# Patient Record
Sex: Male | Born: 1970 | Race: Black or African American | Hispanic: No | Marital: Married | Smoking: Former smoker
Health system: Southern US, Community
[De-identification: ages and names within clinical notes are randomized; demographics above are authoritative.]

## PROBLEM LIST (undated history)

## (undated) DIAGNOSIS — R0602 Shortness of breath: Secondary | ICD-10-CM

## (undated) DIAGNOSIS — Z9109 Other allergy status, other than to drugs and biological substances: Secondary | ICD-10-CM

## (undated) DIAGNOSIS — R51 Headache: Secondary | ICD-10-CM

## (undated) DIAGNOSIS — K219 Gastro-esophageal reflux disease without esophagitis: Secondary | ICD-10-CM

## (undated) DIAGNOSIS — K5900 Constipation, unspecified: Secondary | ICD-10-CM

## (undated) HISTORY — PX: CIRCUMCISION: SUR203

---

## 2011-04-19 ENCOUNTER — Inpatient Hospital Stay (HOSPITAL_COMMUNITY)
Admission: EM | Admit: 2011-04-19 | Discharge: 2011-04-23 | DRG: 418 | Disposition: A | Discharge status: Home / Self Care | Payer: 59 | Attending: Surgery | Admitting: Surgery

## 2011-04-19 ENCOUNTER — Emergency Department (HOSPITAL_COMMUNITY): Payer: 59

## 2011-04-19 ENCOUNTER — Encounter (HOSPITAL_COMMUNITY): Payer: Self-pay | Admitting: Emergency Medicine

## 2011-04-19 DIAGNOSIS — N390 Urinary tract infection, site not specified: Secondary | ICD-10-CM | POA: Diagnosis present

## 2011-04-19 DIAGNOSIS — K8020 Calculus of gallbladder without cholecystitis without obstruction: Secondary | ICD-10-CM | POA: Diagnosis present

## 2011-04-19 DIAGNOSIS — K819 Cholecystitis, unspecified: Secondary | ICD-10-CM

## 2011-04-19 DIAGNOSIS — K8050 Calculus of bile duct without cholangitis or cholecystitis without obstruction: Secondary | ICD-10-CM | POA: Diagnosis present

## 2011-04-19 DIAGNOSIS — K8070 Calculus of gallbladder and bile duct without cholecystitis without obstruction: Principal | ICD-10-CM | POA: Diagnosis present

## 2011-04-19 HISTORY — DX: Gastro-esophageal reflux disease without esophagitis: K21.9

## 2011-04-19 HISTORY — DX: Headache: R51

## 2011-04-19 HISTORY — DX: Other allergy status, other than to drugs and biological substances: Z91.09

## 2011-04-19 HISTORY — DX: Shortness of breath: R06.02

## 2011-04-19 LAB — URINALYSIS, ROUTINE W REFLEX MICROSCOPIC
Glucose, UA: NEGATIVE mg/dL
Hgb urine dipstick: NEGATIVE
Ketones, ur: 40 mg/dL — AB
Nitrite: POSITIVE — AB
Protein, ur: NEGATIVE mg/dL
Specific Gravity, Urine: 1.024 (ref 1.005–1.030)
Urobilinogen, UA: 4 mg/dL — ABNORMAL HIGH (ref 0.0–1.0)
pH: 6 (ref 5.0–8.0)

## 2011-04-19 LAB — URINE MICROSCOPIC-ADD ON

## 2011-04-19 LAB — LIPASE, BLOOD: Lipase: 19 U/L (ref 11–59)

## 2011-04-19 LAB — COMPREHENSIVE METABOLIC PANEL
ALT: 263 U/L — ABNORMAL HIGH (ref 0–53)
AST: 240 U/L — ABNORMAL HIGH (ref 0–37)
Albumin: 4 g/dL (ref 3.5–5.2)
Alkaline Phosphatase: 228 U/L — ABNORMAL HIGH (ref 39–117)
BUN: 7 mg/dL (ref 6–23)
CO2: 26 mEq/L (ref 19–32)
Calcium: 9.3 mg/dL (ref 8.4–10.5)
Chloride: 99 mEq/L (ref 96–112)
Creatinine, Ser: 0.98 mg/dL (ref 0.50–1.35)
GFR calc Af Amer: >90 mL/min (ref >90)
GFR calc non Af Amer: >90 mL/min (ref >90)
Glucose, Bld: 86 mg/dL (ref 70–99)
Potassium: 3.6 mEq/L (ref 3.5–5.1)
Sodium: 136 mEq/L (ref 135–145)
Total Bilirubin: 4.7 mg/dL — ABNORMAL HIGH (ref 0.3–1.2)
Total Protein: 7.6 g/dL (ref 6.0–8.3)

## 2011-04-19 LAB — CBC
HCT: 42.5 % (ref 39.0–52.0)
Hemoglobin: 14.2 g/dL (ref 13.0–17.0)
MCH: 27.7 pg (ref 26.0–34.0)
MCHC: 33.4 g/dL (ref 30.0–36.0)
MCV: 83 fL (ref 78.0–100.0)
Platelets: 125 10*3/uL — ABNORMAL LOW (ref 150–400)
RBC: 5.12 MIL/uL (ref 4.22–5.81)
RDW: 12.6 % (ref 11.5–15.5)
WBC: 3.9 10*3/uL — ABNORMAL LOW (ref 4.0–10.5)

## 2011-04-19 LAB — DIFFERENTIAL
Basophils Absolute: 0 10*3/uL (ref 0.0–0.1)
Basophils Relative: 0 % (ref 0–1)
Eosinophils Absolute: 0.1 10*3/uL (ref 0.0–0.7)
Eosinophils Relative: 2 % (ref 0–5)
Lymphocytes Relative: 25 % (ref 12–46)
Lymphs Abs: 1 10*3/uL (ref 0.7–4.0)
Monocytes Absolute: 0.4 10*3/uL (ref 0.1–1.0)
Monocytes Relative: 9 % (ref 3–12)
Neutro Abs: 2.5 10*3/uL (ref 1.7–7.7)
Neutrophils Relative %: 64 % (ref 43–77)

## 2011-04-19 NOTE — ED Notes (Signed)
Pt. Reports his abdominal pain has been off and on for about a year.  He has nausea at times but is not currently nauseated.

## 2011-04-19 NOTE — ED Notes (Signed)
Pt states he is here tonight for abd pain that he has had off and on now for several months  Pt states Saturday his stomach started hurting in his lower abdomen feeling full and has cramping in the upper part of his abdomen  Pt states sometimes he makes himself throw up to relieve the pressure  Pt states the pressure in his abdomen makes his back hurt causing numbness in his fingers  Pt states he has not eaten since Saturday and has not had a BM since last week

## 2011-04-20 ENCOUNTER — Encounter (HOSPITAL_COMMUNITY): Admission: EM | Disposition: A | Discharge status: Home / Self Care | Payer: Self-pay | Source: Home / Self Care

## 2011-04-20 ENCOUNTER — Encounter (HOSPITAL_COMMUNITY): Payer: Self-pay | Admitting: Gastroenterology

## 2011-04-20 ENCOUNTER — Emergency Department (HOSPITAL_COMMUNITY): Payer: 59

## 2011-04-20 DIAGNOSIS — K8050 Calculus of bile duct without cholangitis or cholecystitis without obstruction: Secondary | ICD-10-CM | POA: Diagnosis present

## 2011-04-20 DIAGNOSIS — R109 Unspecified abdominal pain: Secondary | ICD-10-CM

## 2011-04-20 DIAGNOSIS — K8020 Calculus of gallbladder without cholecystitis without obstruction: Secondary | ICD-10-CM | POA: Diagnosis present

## 2011-04-20 HISTORY — PX: ERCP: SHX5425

## 2011-04-20 LAB — BASIC METABOLIC PANEL
BUN: 8 mg/dL (ref 6–23)
CO2: 29 mEq/L (ref 19–32)
Chloride: 100 mEq/L (ref 96–112)
Creatinine, Ser: 1.1 mg/dL (ref 0.50–1.35)
Glucose, Bld: 85 mg/dL (ref 70–99)

## 2011-04-20 LAB — CBC
HCT: 44.6 % (ref 39.0–52.0)
Hemoglobin: 14.8 g/dL (ref 13.0–17.0)
MCV: 83.2 fL (ref 78.0–100.0)
WBC: 4.3 10*3/uL (ref 4.0–10.5)

## 2011-04-20 LAB — DIFFERENTIAL
Lymphocytes Relative: 26 % (ref 12–46)
Monocytes Absolute: 0.3 10*3/uL (ref 0.1–1.0)
Monocytes Relative: 7 % (ref 3–12)
Neutro Abs: 2.8 10*3/uL (ref 1.7–7.7)

## 2011-04-20 LAB — HEPATIC FUNCTION PANEL
ALT: 244 U/L — ABNORMAL HIGH (ref 0–53)
Alkaline Phosphatase: 256 U/L — ABNORMAL HIGH (ref 39–117)
Bilirubin, Direct: 5.5 mg/dL — ABNORMAL HIGH (ref 0.0–0.3)
Indirect Bilirubin: 1.9 mg/dL — ABNORMAL HIGH (ref 0.3–0.9)
Total Bilirubin: 7.4 mg/dL — ABNORMAL HIGH (ref 0.3–1.2)

## 2011-04-20 LAB — TYPE AND SCREEN
ABO/RH(D): O POS
Antibody Screen: NEGATIVE

## 2011-04-20 LAB — ABO/RH: ABO/RH(D): O POS

## 2011-04-20 SURGERY — ERCP, WITH INTERVENTION IF INDICATED
Anesthesia: Moderate Sedation

## 2011-04-20 MED ORDER — SODIUM CHLORIDE 0.9 % IV SOLN
INTRAVENOUS | Status: DC
  Administered 2011-04-20: 04:00:00 via INTRAVENOUS

## 2011-04-20 MED ORDER — MIDAZOLAM HCL 10 MG/2ML IJ SOLN
INTRAMUSCULAR | Status: DC | PRN
  Administered 2011-04-20 (×5): 1 mg via INTRAVENOUS
  Administered 2011-04-20: 2 mg via INTRAVENOUS
  Administered 2011-04-20 (×3): 1 mg via INTRAVENOUS
  Administered 2011-04-20: 2 mg via INTRAVENOUS
  Administered 2011-04-20: 1 mg via INTRAVENOUS
  Administered 2011-04-20: 2 mg via INTRAVENOUS
  Administered 2011-04-20: 1 mg via INTRAVENOUS

## 2011-04-20 MED ORDER — HYDROMORPHONE HCL PF 1 MG/ML IJ SOLN
1.0000 mg | INTRAMUSCULAR | Status: DC | PRN
  Administered 2011-04-20 – 2011-04-22 (×4): 1 mg via INTRAVENOUS
  Administered 2011-04-22: 2 mg via INTRAVENOUS
  Filled 2011-04-20 (×6): qty 1

## 2011-04-20 MED ORDER — SODIUM CHLORIDE 0.9 % IV SOLN
INTRAVENOUS | Status: DC | PRN
  Administered 2011-04-20: 12:00:00

## 2011-04-20 MED ORDER — PIPERACILLIN-TAZOBACTAM 3.375 G IVPB
3.3750 g | Freq: Three times a day (TID) | INTRAVENOUS | Status: DC
  Administered 2011-04-20 – 2011-04-23 (×10): 3.375 g via INTRAVENOUS
  Filled 2011-04-20 (×15): qty 50

## 2011-04-20 MED ORDER — ONDANSETRON HCL 4 MG/2ML IJ SOLN
4.0000 mg | Freq: Four times a day (QID) | INTRAMUSCULAR | Status: DC | PRN
  Administered 2011-04-20 – 2011-04-22 (×2): 4 mg via INTRAVENOUS
  Filled 2011-04-20 (×2): qty 2

## 2011-04-20 MED ORDER — PIPERACILLIN-TAZOBACTAM 3.375 G IVPB
3.3750 g | Freq: Once | INTRAVENOUS | Status: AC
  Administered 2011-04-20: 3.375 g via INTRAVENOUS
  Filled 2011-04-20 (×2): qty 50

## 2011-04-20 MED ORDER — FENTANYL CITRATE 0.05 MG/ML IJ SOLN
INTRAMUSCULAR | Status: DC | PRN
  Administered 2011-04-20 (×3): 12.5 ug via INTRAVENOUS
  Administered 2011-04-20: 25 ug via INTRAVENOUS
  Administered 2011-04-20: 12.5 ug via INTRAVENOUS
  Administered 2011-04-20 (×2): 25 ug via INTRAVENOUS
  Administered 2011-04-20 (×3): 12.5 ug via INTRAVENOUS

## 2011-04-20 MED ORDER — PANTOPRAZOLE SODIUM 40 MG IV SOLR
40.0000 mg | Freq: Every day | INTRAVENOUS | Status: DC
  Administered 2011-04-20 – 2011-04-22 (×3): 40 mg via INTRAVENOUS
  Filled 2011-04-20 (×5): qty 40

## 2011-04-20 MED ORDER — KCL IN DEXTROSE-NACL 20-5-0.45 MEQ/L-%-% IV SOLN
INTRAVENOUS | Status: DC
  Administered 2011-04-20 – 2011-04-22 (×5): via INTRAVENOUS
  Filled 2011-04-20 (×9): qty 1000

## 2011-04-20 MED ORDER — BUTAMBEN-TETRACAINE-BENZOCAINE 2-2-14 % EX AERO
INHALATION_SPRAY | CUTANEOUS | Status: DC | PRN
  Administered 2011-04-20: 2 via TOPICAL

## 2011-04-20 NOTE — H&P (Signed)
Kacee Koren is an 41 y.o. male.   Chief Complaint: abdominal pain HPI: 41 year old African American male comes to the ED complaining of several days of crampy upper abdominal pain. He has had this abdominal pain on and off for about a year. It will generally last several days and then resolved. He describes it as crampy pain in his upper abdomen and a sensation of feeling full in his lower abdomen. It is generally associated with nausea and some occasional emesis. He denies any jaundice or pruritus. He denies any acholic stools. He denies any pain or difficulty swallowing. The most recent episode started about 2 days ago. His discomfort has been constant. He reports a decreased appetite since Saturday. He reports no bowel movement since Friday. It is normal for him to have one bowel movement every 2-3 days. He has tried taking a stool softener for relief which was unsuccessful. He denies any personal or family history of cancer. He states that he hasn't had this checked out before because of insurance and job reasons. He denies any fevers or chills. Denies any melena or hematochezia. He denies any hematemesis.  History reviewed. No pertinent past medical history.  History reviewed. No pertinent past surgical history.  Family History  Problem Relation Age of Onset  . Hypertension Other   . Diabetes Other    Social History:  reports that he has never smoked. He does not have any smokeless tobacco history on file. He reports that he does not drink alcohol or use illicit drugs.  Allergies: No Known Allergies  Medications Prior to Admission  Medication Dose Route Frequency Provider Last Rate Last Dose  . 0.9 %  sodium chloride infusion   Intravenous Continuous Roma Kayser Schorr, NP 125 mL/hr at 04/20/11 0340    . piperacillin-tazobactam (ZOSYN) IVPB 3.375 g  3.375 g Intravenous Once Leanne Chang, NP   3.375 g at 04/20/11 0340   No current outpatient prescriptions on file as of  04/19/2011.    Results for orders placed during the hospital encounter of 04/19/11 (from the past 48 hour(s))  CBC     Status: Abnormal   Collection Time   04/19/11  9:35 PM      Component Value Range Comment   WBC 3.9 (*) 4.0 - 10.5 (K/uL)    RBC 5.12  4.22 - 5.81 (MIL/uL)    Hemoglobin 14.2  13.0 - 17.0 (g/dL)    HCT 16.1  09.6 - 04.5 (%)    MCV 83.0  78.0 - 100.0 (fL)    MCH 27.7  26.0 - 34.0 (pg)    MCHC 33.4  30.0 - 36.0 (g/dL)    RDW 40.9  81.1 - 91.4 (%)    Platelets 125 (*) 150 - 400 (K/uL)   DIFFERENTIAL     Status: Normal   Collection Time   04/19/11  9:35 PM      Component Value Range Comment   Neutrophils Relative 64  43 - 77 (%)    Neutro Abs 2.5  1.7 - 7.7 (K/uL)    Lymphocytes Relative 25  12 - 46 (%)    Lymphs Abs 1.0  0.7 - 4.0 (K/uL)    Monocytes Relative 9  3 - 12 (%)    Monocytes Absolute 0.4  0.1 - 1.0 (K/uL)    Eosinophils Relative 2  0 - 5 (%)    Eosinophils Absolute 0.1  0.0 - 0.7 (K/uL)    Basophils Relative 0  0 - 1 (%)  Basophils Absolute 0.0  0.0 - 0.1 (K/uL)   COMPREHENSIVE METABOLIC PANEL     Status: Abnormal   Collection Time   04/19/11  9:35 PM      Component Value Range Comment   Sodium 136  135 - 145 (mEq/L)    Potassium 3.6  3.5 - 5.1 (mEq/L)    Chloride 99  96 - 112 (mEq/L)    CO2 26  19 - 32 (mEq/L)    Glucose, Bld 86  70 - 99 (mg/dL)    BUN 7  6 - 23 (mg/dL)    Creatinine, Ser 4.09  0.50 - 1.35 (mg/dL)    Calcium 9.3  8.4 - 10.5 (mg/dL)    Total Protein 7.6  6.0 - 8.3 (g/dL)    Albumin 4.0  3.5 - 5.2 (g/dL)    AST 811 (*) 0 - 37 (U/L)    ALT 263 (*) 0 - 53 (U/L)    Alkaline Phosphatase 228 (*) 39 - 117 (U/L)    Total Bilirubin 4.7 (*) 0.3 - 1.2 (mg/dL)    GFR calc non Af Amer >90  >90 (mL/min)    GFR calc Af Amer >90  >90 (mL/min)   LIPASE, BLOOD     Status: Normal   Collection Time   04/19/11  9:35 PM      Component Value Range Comment   Lipase 19  11 - 59 (U/L)   URINALYSIS, ROUTINE W REFLEX MICROSCOPIC     Status: Abnormal     Collection Time   04/19/11 10:07 PM      Component Value Range Comment   Color, Urine ORANGE (*) YELLOW  BIOCHEMICALS MAY BE AFFECTED BY COLOR   APPearance CLEAR  CLEAR     Specific Gravity, Urine 1.024  1.005 - 1.030     pH 6.0  5.0 - 8.0     Glucose, UA NEGATIVE  NEGATIVE (mg/dL)    Hgb urine dipstick NEGATIVE  NEGATIVE     Bilirubin Urine LARGE (*) NEGATIVE     Ketones, ur 40 (*) NEGATIVE (mg/dL)    Protein, ur NEGATIVE  NEGATIVE (mg/dL)    Urobilinogen, UA 4.0 (*) 0.0 - 1.0 (mg/dL)    Nitrite POSITIVE (*) NEGATIVE     Leukocytes, UA SMALL (*) NEGATIVE    URINE MICROSCOPIC-ADD ON     Status: Abnormal   Collection Time   04/19/11 10:07 PM      Component Value Range Comment   Squamous Epithelial / LPF RARE  RARE     WBC, UA 0-2  <3 (WBC/hpf)    RBC / HPF 0-2  <3 (RBC/hpf)    Bacteria, UA MANY (*) RARE     Urine-Other MUCOUS PRESENT      US Abdomen Complete  04/20/2011  *RADIOLOGY REPORT*  Clinical Data:  Right upper quadrant abdominal pain and elevated LFTs.  ABDOMINAL ULTRASOUND COMPLETE  Comparison:  None  Findings:  Gallbladder:  There is mild gallbladder wall thickening, measuring 0.3 cm.  Multiple prominent layering stones are seen within the gallbladder, with several stones noted at the neck.  There is question of stones at the cystic duct, though this is not well characterized.  No definite ultrasonographic Murphy's sign is elicited; the patient's pain is centered under the xiphoid process.  There is no evidence of gallbladder dilatation to suggest obstruction at the cystic duct.  No pericholecystic fluid is seen.  Common Bile Duct:  0.8 cm in diameter; dilated, raising question for distal obstruction.  Liver:  Normal parenchymal echogenicity and echotexture; no focal lesions identified. There is suggestion of mild intrahepatic biliary ductal dilatation.  Limited Doppler evaluation demonstrates normal blood flow within the liver.  IVC:  Unremarkable in appearance.  Pancreas:   Although the pancreas is difficult to visualize in its entirety due to overlying bowel gas, no focal pancreatic abnormality is identified.  Spleen:  9.1 cm in length; within normal limits in size and echotexture.  Right kidney:  10.2 cm in length; normal in size, configuration and parenchymal echogenicity.  No evidence of mass or hydronephrosis.  Left kidney:  10.2 cm in length; normal in size, configuration and parenchymal echogenicity.  No evidence of mass or hydronephrosis.  Abdominal Aorta:  Normal in caliber; no aneurysm identified.  IMPRESSION:  1.  Mild dilatation of the common hepatic duct to 0.8 cm, with suggestion of mild intrahepatic biliary ductal dilatation, raising question for distal obstruction. 2.  Prominent stones noted layering dependently within the gallbladder.  Stones noted at the neck, with question of stones at the cystic duct.  Mild associated gallbladder wall thickening seen, but no pericholecystic fluid or ultrasonographic Murphy's sign is elicited.  No evidence of gallbladder dilatation to suggest obstruction at the cystic duct.  Original Report Authenticated By: Tonia Ghent, M.D.    Review of Systems  Constitutional: Positive for weight loss (10-20lb wt loss past year). Negative for fever, chills and diaphoresis.  HENT: Negative for nosebleeds.        Seasonal allergies  Eyes: Negative for blurred vision, double vision and redness.  Respiratory: Negative for shortness of breath and wheezing.   Cardiovascular: Negative for chest pain, palpitations, orthopnea and leg swelling.  Gastrointestinal:       See hpi  Genitourinary: Negative for dysuria, urgency and frequency.  Musculoskeletal: Negative for myalgias and joint pain.  Skin: Negative for itching and rash.  Neurological: Negative for tingling, focal weakness, seizures, loss of consciousness, weakness and headaches.  Endo/Heme/Allergies: Does not bruise/bleed easily.  Psychiatric/Behavioral: Negative for depression.      Blood pressure 133/88, pulse 64, temperature 98.8 F (37.1 C), temperature source Oral, resp. rate 14, weight 175 lb (79.379 kg), SpO2 97.00%. Physical Exam  Vitals reviewed. Constitutional: He is oriented to person, place, and time. He appears well-developed and well-nourished. No distress.  HENT:  Head: Normocephalic and atraumatic.  Right Ear: External ear normal.  Left Ear: External ear normal.  Nose: Nose normal.  Eyes: Conjunctivae are normal. Scleral icterus (mild) is present.  Neck: Normal range of motion. Neck supple. No JVD present. No tracheal deviation present. No thyromegaly present.  Cardiovascular: Normal rate, regular rhythm and normal heart sounds.   No murmur heard. Respiratory: Effort normal and breath sounds normal. No respiratory distress. He has no wheezes.  GI: Soft. Bowel sounds are normal. He exhibits no distension. There is tenderness in the right upper quadrant. There is no rigidity, no rebound and no guarding.  Musculoskeletal: Normal range of motion. He exhibits no edema and no tenderness.  Lymphadenopathy:    He has no cervical adenopathy.  Neurological: He is alert and oriented to person, place, and time. He exhibits normal muscle tone.  Skin: Skin is warm and dry. No rash noted. He is not diaphoretic. No erythema.  Psychiatric: He has a normal mood and affect. His behavior is normal. Judgment and thought content normal.     Assessment/Plan Symptomatic cholelithiasis versus acute cholecystitis Choledocholithiasis Urinary tract infection  His LFTs are elevated suggestive of a stone  in the common bile duct. We will consult GI medicine for an ERCP followed by cholecystectomy. He will be kept n.p.o. He will be continued on IV fluids and antibiotics. Sequential compression devices will be ordered. Chemical DVT prophylaxis will be held since hopefully will have an ERCP later today.  I discussed gallbladder disease and common bile duct stone management  with the patient.  Mary Sella. Andrey Campanile, MD, FACS General, Bariatric, & Minimally Invasive Surgery [facility], [place]   [facility] M 04/20/2011, 6:39 AM

## 2011-04-20 NOTE — ED Notes (Signed)
Blood drawn for lab.

## 2011-04-20 NOTE — ED Provider Notes (Signed)
History     CSN: [id number]  Arrival date & time 04/19/11  1839   First MD Initiated Contact with Patient 04/19/11 2021      No chief complaint on file.    Patient is a 41 y.o. male presenting with abdominal pain.  Abdominal Pain The primary symptoms of the illness include abdominal pain and nausea. The primary symptoms of the illness do not include fever, fatigue, shortness of breath, vomiting, diarrhea, hematemesis, hematochezia or dysuria. The current episode started more than 2 days ago. The onset of the illness was gradual. The problem has not changed since onset. The patient has not had a change in bowel habit. Symptoms associated with the illness do not include chills, constipation or frequency.   patient reports abdominal pain that he had for several months. States he came to be evaluated tonight because he has insurance now. He describes that sometimes the pain is upper abdomen and sometimes the pain is very lower abdomen. He has occasional transient nausea but no vomiting. He states at times it feels like he has fever but is unsure. Nothing seems to make pain better or worse and he states the pain feels like a "cramp". Admits that he is still having mild upper abdominal "cramping". Patient denies chest pain, shortness of breath, cough or any other associated symptoms.  History reviewed. No pertinent past medical history.  History reviewed. No pertinent past surgical history.  Family History  Problem Relation Age of Onset  . Hypertension Other   . Diabetes Other     History  Substance Use Topics  . Smoking status: Never Smoker   . Smokeless tobacco: Not on file  . Alcohol Use: No      Review of Systems  Constitutional: Negative.  Negative for fever, chills and fatigue.  HENT: Negative.   Eyes: Negative.   Respiratory: Negative.  Negative for shortness of breath.   Cardiovascular: Negative.   Gastrointestinal: Positive for nausea and abdominal pain. Negative for  vomiting, diarrhea, constipation, hematochezia and hematemesis.  Genitourinary: Negative.  Negative for dysuria and frequency.  Musculoskeletal: Negative.   Skin: Negative.   Neurological: Negative.   Hematological: Negative.   Psychiatric/Behavioral: Negative.     Allergies  Review of patient's allergies indicates no known allergies.  Home Medications   Current Outpatient Rx  Name Route Sig Dispense Refill  . DIPHENHYDRAMINE HCL (SLEEP) 25 MG PO TABS Oral Take 25 mg by mouth at bedtime as needed. For allergy relief    . IBUPROFEN 200 MG PO TABS Oral Take 200 mg by mouth every 6 (six) hours as needed. For pain relief    . SENNOSIDES 8.6 MG PO TABS Oral Take 1 tablet by mouth daily.      BP 129/82  Pulse 63  Temp(Src) 98.6 F (37 C) (Oral)  Resp 15  Wt 175 lb (79.379 kg)  SpO2 100%  Physical Exam  Constitutional: He appears well-developed and well-nourished.  HENT:  Head: Normocephalic and atraumatic.  Eyes: Conjunctivae are normal.  Neck: Neck supple.  Cardiovascular: Normal rate and regular rhythm.   Pulmonary/Chest: Effort normal and breath sounds normal.  Abdominal: Soft. Bowel sounds are normal. There is tenderness in the epigastric area.       Very mild epigastric TTP.   Musculoskeletal: Normal range of motion.  Neurological: He is alert.  Skin: Skin is warm and dry.  Psychiatric: He has a normal mood and affect.    ED Course  Procedures   Pt has  continued to rest in NAD. LFT's w/ significant elevations. Will obtain abd u/s. Pt has continued to decline medication for pain or nausea.  0315: abd u/s  IMPRESSION:  1. Mild dilatation of the common hepatic duct to 0.8 cm, with suggestion of mild intrahepatic biliary ductal dilatation, raising question for distal obstruction. 2. Prominent stones noted layering dependently within the gallbladder. Stones noted at the neck, with question of stones at the cystic duct. Mild associated gallbladder wall thickening  seen, but no pericholecystic fluid or ultrasonographic Murphy's sign is elicited. No evidence of gallbladder dilatation to suggest obstruction at the cystic duct.  Original Report Authenticated By: Tonia Ghent, M.D.  Discussed pt w/ Dr. Nicanor Alcon. Will consult w/ surgery for plan.  33: Dr Nicanor Alcon has spoken w/ Dr Andrey Campanile who has requested pt be kept NPO, given IV Zosyn and he will be in to see. Discussed plan w/ pt who is agreeable w/ plan.       Labs Reviewed  CBC - Abnormal; Notable for the following:    WBC 3.9 (*)    Platelets 125 (*)    All other components within normal limits  COMPREHENSIVE METABOLIC PANEL - Abnormal; Notable for the following:    AST 240 (*)    ALT 263 (*)    Alkaline Phosphatase 228 (*)    Total Bilirubin 4.7 (*)    All other components within normal limits  URINALYSIS, ROUTINE W REFLEX MICROSCOPIC - Abnormal; Notable for the following:    Color, Urine ORANGE (*) BIOCHEMICALS MAY BE AFFECTED BY COLOR   Bilirubin Urine LARGE (*)    Ketones, ur 40 (*)    Urobilinogen, UA 4.0 (*)    Nitrite POSITIVE (*)    Leukocytes, UA SMALL (*)    All other components within normal limits  URINE MICROSCOPIC-ADD ON - Abnormal; Notable for the following:    Bacteria, UA MANY (*)    All other components within normal limits  DIFFERENTIAL  LIPASE, BLOOD   US Abdomen Complete  04/20/2011  *RADIOLOGY REPORT*  Clinical Data:  Right upper quadrant abdominal pain and elevated LFTs.  ABDOMINAL ULTRASOUND COMPLETE  Comparison:  None  Findings:  Gallbladder:  There is mild gallbladder wall thickening, measuring 0.3 cm.  Multiple prominent layering stones are seen within the gallbladder, with several stones noted at the neck.  There is question of stones at the cystic duct, though this is not well characterized.  No definite ultrasonographic Murphy's sign is elicited; the patient's pain is centered under the xiphoid process.  There is no evidence of gallbladder dilatation to  suggest obstruction at the cystic duct.  No pericholecystic fluid is seen.  Common Bile Duct:  0.8 cm in diameter; dilated, raising question for distal obstruction.  Liver:  Normal parenchymal echogenicity and echotexture; no focal lesions identified. There is suggestion of mild intrahepatic biliary ductal dilatation.  Limited Doppler evaluation demonstrates normal blood flow within the liver.  IVC:  Unremarkable in appearance.  Pancreas:  Although the pancreas is difficult to visualize in its entirety due to overlying bowel gas, no focal pancreatic abnormality is identified.  Spleen:  9.1 cm in length; within normal limits in size and echotexture.  Right kidney:  10.2 cm in length; normal in size, configuration and parenchymal echogenicity.  No evidence of mass or hydronephrosis.  Left kidney:  10.2 cm in length; normal in size, configuration and parenchymal echogenicity.  No evidence of mass or hydronephrosis.  Abdominal Aorta:  Normal in caliber; no aneurysm  identified.  IMPRESSION:  1.  Mild dilatation of the common hepatic duct to 0.8 cm, with suggestion of mild intrahepatic biliary ductal dilatation, raising question for distal obstruction. 2.  Prominent stones noted layering dependently within the gallbladder.  Stones noted at the neck, with question of stones at the cystic duct.  Mild associated gallbladder wall thickening seen, but no pericholecystic fluid or ultrasonographic Murphy's sign is elicited.  No evidence of gallbladder dilatation to suggest obstruction at the cystic duct.  Original Report Authenticated By: Tonia Ghent, M.D.     No diagnosis found.    MDM  HPI/PE and clinical findings c/w 1.Choledocholithiasis w/o cholecystitis 2. UTI  Pt admitted by surgery (Dr Andrey Campanile)        Leanne Chang, NP 04/20/11 [phone]

## 2011-04-20 NOTE — ED Notes (Signed)
CBG- [id number] 

## 2011-04-20 NOTE — ED Notes (Signed)
Pt transferred to ENDO-[facility]

## 2011-04-20 NOTE — ED Notes (Signed)
Daniel Morales Diplomatic Services operational officer reports pt is having a ERCP today and to not take pt up to admitted room.  Please call them first.

## 2011-04-20 NOTE — Op Note (Signed)
[facility] [address] [place], [place]  [address]  ERCP PROCEDURE REPORT  PATIENT:  Daniel Morales, Daniel Morales  MR#:  [id number] BIRTHDATE:  03/04/1970  GENDER:  male  ENDOSCOPIST:  Vida Rigger, MD ASSISTANT:  Bary Leriche, Hassel Neth, RN  PROCEDURE DATE:  04/20/2011 PROCEDURE:  ERCP with balloon passage, ERCP with removal of stones, ERCP with sphincterotomy ASA CLASS:  Class I  INDICATIONS:  suspected CBD stone  MEDICATIONS:  162.5 mcg of fentanyl 16 mg Versed TOPICAL ANESTHETIC:  Cetacaine Spray  DESCRIPTION OF PROCEDURE:   After the risks benefits and alternatives of the procedure were thoroughly explained, informed consent was obtained.  The Pentax ERCP [id number] endoscope was introduced through the mouth and advanced to the second portion of the duodenum. He did have a bulbous ampulla unfortunately on multiple cannulation and wire advancement and a few minimal injections we can only opacify the pancreas despite changing to the smaller sphincter tone and smaller wire. We did try to advance the wire deep into the pancreas and then place a pancreatic stent but the one time we could advance the wire what we were exchanging the sphincter tone the wire fell out. We then went ahead and proceeded with needle-knife sphincterotomy in the customary fashion based on the bulbous ampulla however unfortunately we were unable to obtain cannulation despite probing with the wire and seeing minimal bile. We then retried conventional cannulation one more time and was surprising is was able to get deep selective cannulation and on initial cholangiogram obvious small stones were seen in the wire was advanced into the intrahepatics. We then exchanged the sphincter tone for the adjustable 12-15 mm balloon and multiple stones were removed and we then proceeded with 2 negative pull-throughs . We then proceeded with an occlusion cholangiogram which was  normal and 2 more balloon pull-through had the balloon easily passing through the patent sphincterotomy site. There was sluggish but positive biliary drainage and we elected to stop the procedure at this juncture the wire and endoscope were removed  from the patient and the procedure terminated. Patient tolerated the procedure adequately there was no obvious immediate complication <<PROCEDUREIMAGES>>  COMPLICATIONS: None  ENDOSCOPIC IMPRESSION: CBD stone status post removal 1) Bile duct cannulation 2) Stones, multiple in the common bile duct status post removal after sphincterotomy 3) normal pancreatic duct on few injections and few wire advances RECOMMENDATIONS: 1) observation for delayed complication and if none proceed with  2) surgery hopefully tomorrow and I have discussed the case with Dr. Daphine Deutscher 3. If his lower bowel issues continue I will need to see him back post op  ______________________________ Vida Rigger, MD  CC:  Luretha Murphy, MD  n. Rosalie DoctorVida Rigger at 04/20/2011 12:15 PM  [place], Ethelene Browns, [phone]

## 2011-04-20 NOTE — Consult Note (Signed)
Reason for Consult probable CBD stone  Referring Physician: Surgery Dr. Fredric Mare Byrns is an 41 y.o. male.  HPI: The patient for about a year has had episodic right upper quadrant midepigastric pain sometimes with nausea vomiting but no fever chills or night sweats and has lost about 10-15 pound and had noticed some change in bowel habits with increased constipation as well as some rare bright red blood per rectum when he strains. In between these spells he is asymptomatic and he has never been told his liver tests are elevated and he has not donated blood but he does have a life insurance with a physical exam one or 2 years ago. His family history is negative and he never did IV drug and he quit tobacco and alcohol about 10 years ago but he does have tattoos and no other complaint   History reviewed. No pertinent past medical history.  History reviewed. No pertinent past surgical history.  Family History  Problem Relation Age of Onset  . Hypertension Other   . Diabetes Other     Social History:  reports that he has never smoked. He does not have any smokeless tobacco history on file. He reports that he does not drink alcohol or use illicit drugs.  Allergies: No Known Allergies  Medications: I have reviewed the patient's current medications.  Results for orders placed during the hospital encounter of 04/19/11 (from the past 48 hour(s))  CBC     Status: Abnormal   Collection Time   04/19/11  9:35 PM      Component Value Range Comment   WBC 3.9 (*) 4.0 - 10.5 (K/uL)    RBC 5.12  4.22 - 5.81 (MIL/uL)    Hemoglobin 14.2  13.0 - 17.0 (g/dL)    HCT 16.1  09.6 - 04.5 (%)    MCV 83.0  78.0 - 100.0 (fL)    MCH 27.7  26.0 - 34.0 (pg)    MCHC 33.4  30.0 - 36.0 (g/dL)    RDW 40.9  81.1 - 91.4 (%)    Platelets 125 (*) 150 - 400 (K/uL)   DIFFERENTIAL     Status: Normal   Collection Time   04/19/11  9:35 PM      Component Value Range Comment   Neutrophils Relative 64  43 - 77 (%)      Neutro Abs 2.5  1.7 - 7.7 (K/uL)    Lymphocytes Relative 25  12 - 46 (%)    Lymphs Abs 1.0  0.7 - 4.0 (K/uL)    Monocytes Relative 9  3 - 12 (%)    Monocytes Absolute 0.4  0.1 - 1.0 (K/uL)    Eosinophils Relative 2  0 - 5 (%)    Eosinophils Absolute 0.1  0.0 - 0.7 (K/uL)    Basophils Relative 0  0 - 1 (%)    Basophils Absolute 0.0  0.0 - 0.1 (K/uL)   COMPREHENSIVE METABOLIC PANEL     Status: Abnormal   Collection Time   04/19/11  9:35 PM      Component Value Range Comment   Sodium 136  135 - 145 (mEq/L)    Potassium 3.6  3.5 - 5.1 (mEq/L)    Chloride 99  96 - 112 (mEq/L)    CO2 26  19 - 32 (mEq/L)    Glucose, Bld 86  70 - 99 (mg/dL)    BUN 7  6 - 23 (mg/dL)    Creatinine, Ser 7.82  0.50 -  1.35 (mg/dL)    Calcium 9.3  8.4 - 10.5 (mg/dL)    Total Protein 7.6  6.0 - 8.3 (g/dL)    Albumin 4.0  3.5 - 5.2 (g/dL)    AST 865 (*) 0 - 37 (U/L)    ALT 263 (*) 0 - 53 (U/L)    Alkaline Phosphatase 228 (*) 39 - 117 (U/L)    Total Bilirubin 4.7 (*) 0.3 - 1.2 (mg/dL)    GFR calc non Af Amer >90  >90 (mL/min)    GFR calc Af Amer >90  >90 (mL/min)   LIPASE, BLOOD     Status: Normal   Collection Time   04/19/11  9:35 PM      Component Value Range Comment   Lipase 19  11 - 59 (U/L)   URINALYSIS, ROUTINE W REFLEX MICROSCOPIC     Status: Abnormal   Collection Time   04/19/11 10:07 PM      Component Value Range Comment   Color, Urine ORANGE (*) YELLOW  BIOCHEMICALS MAY BE AFFECTED BY COLOR   APPearance CLEAR  CLEAR     Specific Gravity, Urine 1.024  1.005 - 1.030     pH 6.0  5.0 - 8.0     Glucose, UA NEGATIVE  NEGATIVE (mg/dL)    Hgb urine dipstick NEGATIVE  NEGATIVE     Bilirubin Urine LARGE (*) NEGATIVE     Ketones, ur 40 (*) NEGATIVE (mg/dL)    Protein, ur NEGATIVE  NEGATIVE (mg/dL)    Urobilinogen, UA 4.0 (*) 0.0 - 1.0 (mg/dL)    Nitrite POSITIVE (*) NEGATIVE     Leukocytes, UA SMALL (*) NEGATIVE    URINE MICROSCOPIC-ADD ON     Status: Abnormal   Collection Time   04/19/11 10:07 PM       Component Value Range Comment   Squamous Epithelial / LPF RARE  RARE     WBC, UA 0-2  <3 (WBC/hpf)    RBC / HPF 0-2  <3 (RBC/hpf)    Bacteria, UA MANY (*) RARE     Urine-Other MUCOUS PRESENT     CBC     Status: Abnormal   Collection Time   04/20/11  6:34 AM      Component Value Range Comment   WBC 4.3  4.0 - 10.5 (K/uL)    RBC 5.36  4.22 - 5.81 (MIL/uL)    Hemoglobin 14.8  13.0 - 17.0 (g/dL)    HCT 78.4  69.6 - 29.5 (%)    MCV 83.2  78.0 - 100.0 (fL)    MCH 27.6  26.0 - 34.0 (pg)    MCHC 33.2  30.0 - 36.0 (g/dL)    RDW 28.4  13.2 - 44.0 (%)    Platelets 127 (*) 150 - 400 (K/uL)   DIFFERENTIAL     Status: Normal   Collection Time   04/20/11  6:34 AM      Component Value Range Comment   Neutrophils Relative 65  43 - 77 (%)    Neutro Abs 2.8  1.7 - 7.7 (K/uL)    Lymphocytes Relative 26  12 - 46 (%)    Lymphs Abs 1.1  0.7 - 4.0 (K/uL)    Monocytes Relative 7  3 - 12 (%)    Monocytes Absolute 0.3  0.1 - 1.0 (K/uL)    Eosinophils Relative 2  0 - 5 (%)    Eosinophils Absolute 0.1  0.0 - 0.7 (K/uL)    Basophils Relative 0  0 -  1 (%)    Basophils Absolute 0.0  0.0 - 0.1 (K/uL)   BASIC METABOLIC PANEL     Status: Abnormal   Collection Time   04/20/11  6:34 AM      Component Value Range Comment   Sodium 139  135 - 145 (mEq/L)    Potassium 3.7  3.5 - 5.1 (mEq/L)    Chloride 100  96 - 112 (mEq/L)    CO2 29  19 - 32 (mEq/L)    Glucose, Bld 85  70 - 99 (mg/dL)    BUN 8  6 - 23 (mg/dL)    Creatinine, Ser 0.45  0.50 - 1.35 (mg/dL)    Calcium 9.7  8.4 - 10.5 (mg/dL)    GFR calc non Af Amer 82 (*) >90 (mL/min)    GFR calc Af Amer >90  >90 (mL/min)   HEPATIC FUNCTION PANEL     Status: Abnormal   Collection Time   04/20/11  6:34 AM      Component Value Range Comment   Total Protein 7.7  6.0 - 8.3 (g/dL)    Albumin 4.2  3.5 - 5.2 (g/dL)    AST 409 (*) 0 - 37 (U/L)    ALT 244 (*) 0 - 53 (U/L)    Alkaline Phosphatase 256 (*) 39 - 117 (U/L)    Total Bilirubin 7.4 (*) 0.3 - 1.2 (mg/dL)     Bilirubin, Direct 5.5 (*) 0.0 - 0.3 (mg/dL)    Indirect Bilirubin 1.9 (*) 0.3 - 0.9 (mg/dL)     US Abdomen Complete  04/20/2011  *RADIOLOGY REPORT*  Clinical Data:  Right upper quadrant abdominal pain and elevated LFTs.  ABDOMINAL ULTRASOUND COMPLETE  Comparison:  None  Findings:  Gallbladder:  There is mild gallbladder wall thickening, measuring 0.3 cm.  Multiple prominent layering stones are seen within the gallbladder, with several stones noted at the neck.  There is question of stones at the cystic duct, though this is not well characterized.  No definite ultrasonographic Murphy's sign is elicited; the patient's pain is centered under the xiphoid process.  There is no evidence of gallbladder dilatation to suggest obstruction at the cystic duct.  No pericholecystic fluid is seen.  Common Bile Duct:  0.8 cm in diameter; dilated, raising question for distal obstruction.  Liver:  Normal parenchymal echogenicity and echotexture; no focal lesions identified. There is suggestion of mild intrahepatic biliary ductal dilatation.  Limited Doppler evaluation demonstrates normal blood flow within the liver.  IVC:  Unremarkable in appearance.  Pancreas:  Although the pancreas is difficult to visualize in its entirety due to overlying bowel gas, no focal pancreatic abnormality is identified.  Spleen:  9.1 cm in length; within normal limits in size and echotexture.  Right kidney:  10.2 cm in length; normal in size, configuration and parenchymal echogenicity.  No evidence of mass or hydronephrosis.  Left kidney:  10.2 cm in length; normal in size, configuration and parenchymal echogenicity.  No evidence of mass or hydronephrosis.  Abdominal Aorta:  Normal in caliber; no aneurysm identified.  IMPRESSION:  1.  Mild dilatation of the common hepatic duct to 0.8 cm, with suggestion of mild intrahepatic biliary ductal dilatation, raising question for distal obstruction. 2.  Prominent stones noted layering dependently within  the gallbladder.  Stones noted at the neck, with question of stones at the cystic duct.  Mild associated gallbladder wall thickening seen, but no pericholecystic fluid or ultrasonographic Murphy's sign is elicited.  No evidence of gallbladder dilatation  to suggest obstruction at the cystic duct.  Original Report Authenticated By: Tonia Ghent, M.D.    ROS negative except above Blood pressure 131/83, pulse 73, temperature 98.8 F (37.1 C), temperature source Oral, resp. rate 16, weight 79.379 kg (175 lb), SpO2 97.00%. Physical Exam but a signs stable afebrile no acute distress lungs are clear heart regular rate and rhythm abdomen is soft nontender cannot reduplication the pain good peripheral pulses no pedal edema labs and x-ray reviewed LFTs increased from last night  Assessment/Plan: Gallstones and probable CBD stone 2. Some constipation and weight loss questionable etiology Plan: The risks benefits methods of ERCP sphincterotomy stone extraction and success rate as well as the options were discussed with both the patient and his wife and how if his lower bowel problems continued he would need to followup with me. I will proceed with ERCP later today with further workup and plans pending those findings Ketsia Linebaugh E 04/20/2011, 9:01 AM

## 2011-04-20 NOTE — ED Notes (Addendum)
cbg 80 at 1435

## 2011-04-21 ENCOUNTER — Encounter (HOSPITAL_COMMUNITY): Payer: Self-pay | Admitting: Anesthesiology

## 2011-04-21 ENCOUNTER — Encounter (HOSPITAL_COMMUNITY): Payer: Self-pay | Admitting: Gastroenterology

## 2011-04-21 ENCOUNTER — Encounter (HOSPITAL_COMMUNITY): Admission: EM | Disposition: A | Discharge status: Home / Self Care | Payer: Self-pay | Source: Home / Self Care

## 2011-04-21 ENCOUNTER — Inpatient Hospital Stay (HOSPITAL_COMMUNITY): Payer: 59 | Admitting: Anesthesiology

## 2011-04-21 ENCOUNTER — Inpatient Hospital Stay (HOSPITAL_COMMUNITY): Payer: 59

## 2011-04-21 ENCOUNTER — Encounter (HOSPITAL_COMMUNITY): Payer: Self-pay

## 2011-04-21 DIAGNOSIS — K8010 Calculus of gallbladder with chronic cholecystitis without obstruction: Secondary | ICD-10-CM

## 2011-04-21 HISTORY — PX: CHOLECYSTECTOMY: SHX55

## 2011-04-21 LAB — COMPREHENSIVE METABOLIC PANEL
Albumin: 3.3 g/dL — ABNORMAL LOW (ref 3.5–5.2)
Alkaline Phosphatase: 229 U/L — ABNORMAL HIGH (ref 39–117)
CO2: 28 mEq/L (ref 19–32)
Calcium: 9.1 mg/dL (ref 8.4–10.5)
Creatinine, Ser: 1.12 mg/dL (ref 0.50–1.35)
Sodium: 138 mEq/L (ref 135–145)
Total Bilirubin: 2.8 mg/dL — ABNORMAL HIGH (ref 0.3–1.2)
Total Protein: 6.5 g/dL (ref 6.0–8.3)

## 2011-04-21 LAB — CBC
Hemoglobin: 13.4 g/dL (ref 13.0–17.0)
MCH: 28 pg (ref 26.0–34.0)
MCHC: 33.4 g/dL (ref 30.0–36.0)

## 2011-04-21 LAB — MAGNESIUM: Magnesium: 1.7 mg/dL (ref 1.5–2.5)

## 2011-04-21 LAB — GLUCOSE, CAPILLARY
Glucose-Capillary: 80 mg/dL (ref 70–99)
Glucose-Capillary: 85 mg/dL (ref 70–99)

## 2011-04-21 LAB — LIPASE, BLOOD: Lipase: 2555 U/L — ABNORMAL HIGH (ref 11–59)

## 2011-04-21 SURGERY — LAPAROSCOPIC CHOLECYSTECTOMY WITH INTRAOPERATIVE CHOLANGIOGRAM
Anesthesia: General | Site: Abdomen | Wound class: Contaminated

## 2011-04-21 MED ORDER — IOHEXOL 300 MG/ML  SOLN
INTRAMUSCULAR | Status: DC | PRN
  Administered 2011-04-21: 3.5 mL

## 2011-04-21 MED ORDER — LACTATED RINGERS IV SOLN
INTRAVENOUS | Status: DC
  Administered 2011-04-21: 19:00:00 via INTRAVENOUS
  Administered 2011-04-21: 1000 mL via INTRAVENOUS

## 2011-04-21 MED ORDER — BUPIVACAINE LIPOSOME 1.3 % IJ SUSP
INTRAMUSCULAR | Status: DC | PRN
  Administered 2011-04-21: 20 mL

## 2011-04-21 MED ORDER — FENTANYL CITRATE 0.05 MG/ML IJ SOLN
25.0000 ug | INTRAMUSCULAR | Status: DC | PRN
  Administered 2011-04-21: 50 ug via INTRAVENOUS

## 2011-04-21 MED ORDER — ONDANSETRON HCL 4 MG/2ML IJ SOLN
INTRAMUSCULAR | Status: DC | PRN
  Administered 2011-04-21: 4 mg via INTRAVENOUS

## 2011-04-21 MED ORDER — NEOSTIGMINE METHYLSULFATE 1 MG/ML IJ SOLN
INTRAMUSCULAR | Status: DC | PRN
  Administered 2011-04-21: 3.5 mg via INTRAVENOUS

## 2011-04-21 MED ORDER — LACTATED RINGERS IR SOLN
Status: DC | PRN
  Administered 2011-04-21: 1000 mL

## 2011-04-21 MED ORDER — MIDAZOLAM HCL 5 MG/5ML IJ SOLN
INTRAMUSCULAR | Status: DC | PRN
  Administered 2011-04-21: 2 mg via INTRAVENOUS

## 2011-04-21 MED ORDER — LACTATED RINGERS IV SOLN: INTRAVENOUS | Status: DC

## 2011-04-21 MED ORDER — PROMETHAZINE HCL 25 MG/ML IJ SOLN
12.5000 mg | INTRAMUSCULAR | Status: DC | PRN
  Administered 2011-04-21: 12.5 mg via INTRAVENOUS

## 2011-04-21 MED ORDER — SUCCINYLCHOLINE CHLORIDE 20 MG/ML IJ SOLN
INTRAMUSCULAR | Status: DC | PRN
  Administered 2011-04-21: 100 mg via INTRAVENOUS

## 2011-04-21 MED ORDER — SODIUM CHLORIDE 0.9 % IR SOLN
Status: DC | PRN
  Administered 2011-04-21: 1000 mL

## 2011-04-21 MED ORDER — PROPOFOL 10 MG/ML IV EMUL
INTRAVENOUS | Status: DC | PRN
  Administered 2011-04-21: 200 mg via INTRAVENOUS

## 2011-04-21 MED ORDER — LIDOCAINE HCL (CARDIAC) 20 MG/ML IV SOLN
INTRAVENOUS | Status: DC | PRN
  Administered 2011-04-21: 100 mg via INTRAVENOUS

## 2011-04-21 MED ORDER — FENTANYL CITRATE 0.05 MG/ML IJ SOLN
INTRAMUSCULAR | Status: DC | PRN
  Administered 2011-04-21 (×2): 50 ug via INTRAVENOUS
  Administered 2011-04-21: 100 ug via INTRAVENOUS
  Administered 2011-04-21 (×3): 50 ug via INTRAVENOUS

## 2011-04-21 MED ORDER — EPHEDRINE SULFATE 50 MG/ML IJ SOLN
INTRAMUSCULAR | Status: DC | PRN
  Administered 2011-04-21 (×2): 10 mg via INTRAVENOUS
  Administered 2011-04-21 (×2): 5 mg via INTRAVENOUS

## 2011-04-21 MED ORDER — HEPARIN SODIUM (PORCINE) 5000 UNIT/ML IJ SOLN
5000.0000 [IU] | Freq: Three times a day (TID) | INTRAMUSCULAR | Status: DC
Start: 2011-04-21 — End: 2011-04-23
  Administered 2011-04-21 – 2011-04-23 (×5): 5000 [IU] via SUBCUTANEOUS
  Filled 2011-04-21 (×8): qty 1

## 2011-04-21 MED ORDER — GLYCOPYRROLATE 0.2 MG/ML IJ SOLN
INTRAMUSCULAR | Status: DC | PRN
  Administered 2011-04-21: .7 mg via INTRAVENOUS

## 2011-04-21 MED ORDER — ACETAMINOPHEN 10 MG/ML IV SOLN
INTRAVENOUS | Status: DC | PRN
  Administered 2011-04-21: 1000 mg via INTRAVENOUS

## 2011-04-21 MED ORDER — BUPIVACAINE LIPOSOME 1.3 % IJ SUSP
20.0000 mL | Freq: Once | INTRAMUSCULAR | Status: DC
  Filled 2011-04-21: qty 20

## 2011-04-21 MED ORDER — ROCURONIUM BROMIDE 100 MG/10ML IV SOLN
INTRAVENOUS | Status: DC | PRN
  Administered 2011-04-21: 35 mg via INTRAVENOUS
  Administered 2011-04-21: 15 mg via INTRAVENOUS

## 2011-04-21 MED ORDER — DEXAMETHASONE SODIUM PHOSPHATE 10 MG/ML IJ SOLN
INTRAMUSCULAR | Status: DC | PRN
  Administered 2011-04-21: 10 mg via INTRAVENOUS

## 2011-04-21 MED ORDER — PROMETHAZINE HCL 25 MG/ML IJ SOLN: 6.2500 mg | INTRAMUSCULAR | Status: DC | PRN

## 2011-04-21 SURGICAL SUPPLY — 50 items
APPLIER CLIP 5 13 M/L LIGAMAX5 (MISCELLANEOUS)
APPLIER CLIP ROT 10 11.4 M/L (STAPLE) ×2
BENZOIN TINCTURE PRP APPL 2/3 (GAUZE/BANDAGES/DRESSINGS) ×2 IMPLANT
CABLE HIGH FREQUENCY MONO STRZ (ELECTRODE) IMPLANT
CANISTER SUCTION 2500CC (MISCELLANEOUS) ×2 IMPLANT
CATH REDDICK CHOLANGI 4FR 50CM (CATHETERS) ×2 IMPLANT
CLIP APPLIE 5 13 M/L LIGAMAX5 (MISCELLANEOUS) IMPLANT
CLIP APPLIE ROT 10 11.4 M/L (STAPLE) ×1 IMPLANT
CLOTH BEACON ORANGE TIMEOUT ST (SAFETY) ×2 IMPLANT
COVER MAYO STAND STRL (DRAPES) ×2 IMPLANT
COVER SURGICAL LIGHT HANDLE (MISCELLANEOUS) ×2 IMPLANT
DECANTER SPIKE VIAL GLASS SM (MISCELLANEOUS) IMPLANT
DRAPE C-ARM 42X72 X-RAY (DRAPES) ×2 IMPLANT
DRAPE LAPAROSCOPIC ABDOMINAL (DRAPES) ×2 IMPLANT
ELECT REM PT RETURN 9FT ADLT (ELECTROSURGICAL) ×2
ELECTRODE REM PT RTRN 9FT ADLT (ELECTROSURGICAL) ×1 IMPLANT
GAUZE SPONGE 2X2 8PLY STRL LF (GAUZE/BANDAGES/DRESSINGS) ×2 IMPLANT
GLOVE BIOGEL M 8.0 STRL (GLOVE) ×2 IMPLANT
GLOVE BIOGEL M STRL SZ7.5 (GLOVE) ×2 IMPLANT
GLOVE BIOGEL PI IND STRL 7.0 (GLOVE) ×1 IMPLANT
GLOVE BIOGEL PI INDICATOR 7.0 (GLOVE) ×1
GLOVE SURG SS PI 6.5 STRL IVOR (GLOVE) ×2 IMPLANT
GLOVE SURG SS PI 8.0 STRL IVOR (GLOVE) ×2 IMPLANT
GLOVE SURG SS PI 8.5 STRL IVOR (GLOVE) ×1
GLOVE SURG SS PI 8.5 STRL STRW (GLOVE) ×1 IMPLANT
GOWN STRL NON-REIN LRG LVL3 (GOWN DISPOSABLE) ×2 IMPLANT
GOWN STRL REIN XL XLG (GOWN DISPOSABLE) ×4 IMPLANT
HEMOSTAT SURGICEL 4X8 (HEMOSTASIS) IMPLANT
IV CATH 14GX2 1/4 (CATHETERS) ×2 IMPLANT
IV LACTATED RINGERS 1000ML (IV SOLUTION) ×2 IMPLANT
KIT BASIN OR (CUSTOM PROCEDURE TRAY) ×2 IMPLANT
NS IRRIG 1000ML POUR BTL (IV SOLUTION) ×2 IMPLANT
POUCH SPECIMEN RETRIEVAL 10MM (ENDOMECHANICALS) ×2 IMPLANT
SCISSORS LAP 5X35 DISP (ENDOMECHANICALS) ×2 IMPLANT
SET IRRIG TUBING LAPAROSCOPIC (IRRIGATION / IRRIGATOR) ×2 IMPLANT
SLEEVE Z-THREAD 5X100MM (TROCAR) IMPLANT
SOLUTION ANTI FOG 6CC (MISCELLANEOUS) ×2 IMPLANT
SPONGE GAUZE 2X2 STER 10/PKG (GAUZE/BANDAGES/DRESSINGS) ×2
STRIP CLOSURE SKIN 1/2X4 (GAUZE/BANDAGES/DRESSINGS) ×2 IMPLANT
SUT VIC AB 4-0 SH 18 (SUTURE) ×2 IMPLANT
SUT VICRYL 0 UR6 27IN ABS (SUTURE) ×2 IMPLANT
SYR 30ML LL (SYRINGE) IMPLANT
TOWEL OR 17X26 10 PK STRL BLUE (TOWEL DISPOSABLE) ×4 IMPLANT
TRAY LAP CHOLE (CUSTOM PROCEDURE TRAY) ×2 IMPLANT
TROCAR BLADELESS OPT 5 75 (ENDOMECHANICALS) ×4 IMPLANT
TROCAR XCEL BLUNT TIP 100MML (ENDOMECHANICALS) ×2 IMPLANT
TROCAR XCEL NON-BLD 11X100MML (ENDOMECHANICALS) ×2 IMPLANT
TROCAR Z-THREAD FIOS 11X100 BL (TROCAR) ×2 IMPLANT
TROCAR Z-THREAD FIOS 5X100MM (TROCAR) IMPLANT
TUBING INSUFFLATION 10FT LAP (TUBING) ×2 IMPLANT

## 2011-04-21 NOTE — Progress Notes (Signed)
Daniel Morales 9:11 AM  Subjective: The patient is passing gas and has less pain but his throat is a little sore and he says his urine is still dark but overall doing okay  Objective: Vital signs stable afebrile abdomen is soft nontender good bowel sounds labs improved  Assessment: Status post-ERCP and stone extraction  Plan: Okay for laparoscopic cholecystectomy with intraoperative cholangiogram to confirm no residual stones and please call us if we can be of any further assistance particularly happy to see back if lower bowel issues continue  [facility] E

## 2011-04-21 NOTE — Op Note (Signed)
Daniel Morales @date @  Procedure: Laparoscopic Cholecystectomy with intraoperative cholangiogram  Surgeon: Wenda Low, MD, FACS Asst:  none  Anes:  General  Drains: None  Findings: Severe sigmoid shaped fibrotic gallbladder with multiple yellow mullberry stones.  Normal IOC  Description of Procedure: The patient was taken to OR 6  and given general anesthesia.  The patient was prepped with PCMX and draped sterilely. A time out was performed.  Access to the abdomen was achieved with [facility] placed through the umbilicus.  Port placement included two 11 mm and two 5 mm laterally.    The gallbladder was visualized and the fundus was grasped and the gallbladder was elevated.  The lower portion of the GB was bowed and attached to the duodenum and this was taken off with sharp dissection.   Traction on the infundibulum allowed for successful demonstration of the critical view. Inflammatory changes were severe and fibrotic.  The cystic duct was identified and clipped up on the gallbladder and an incision was made in the cystic duct and the Reddick catheter was inserted after milking the cystic duct of any debris. A dynamic cholangiogram was performed which demonstrated free flow of contrast in to the duodenum and good intrahepatic filling.    The cystic duct was then triple clipped and divided, the cystic artery was double clipped and divided and then the gallbladder was removed from the gallbladder bed. Removal of the gallbladder from the gallbladder bed was tedious and a portion of the back wall remained in this contorted GB.  The gallbladder was then placed in a bag and brought out through one of the 10 mm trocar sites. The gallbladder bed was inspected and no bleeding or bile leaks were seen.   Laparoscopic visualization was used when closing fascial defects for trocar sites.   Incisions were injected with Exparel and closed with 4-0 Vicryl and steristrips on the skin.  Sponge and needle count  were correct.    The patient was taken to the recovery room in satisfactory condition.

## 2011-04-21 NOTE — Addendum Note (Signed)
Addendum  created 04/21/11 1819 by Gaetano Hawthorne, MD   Modules edited:Orders

## 2011-04-21 NOTE — Transfer of Care (Signed)
Immediate Anesthesia Transfer of Care Note  Patient: Daniel Morales  Procedure(s) Performed: Procedure(s) (LRB): LAPAROSCOPIC CHOLECYSTECTOMY WITH INTRAOPERATIVE CHOLANGIOGRAM (N/A)  Patient Location: PACU  Anesthesia Type: General  Level of Consciousness: awake, alert , oriented and patient cooperative  Airway & Oxygen Therapy: Patient Spontanous Breathing and Patient connected to face mask oxygen  Post-op Assessment: Report given to PACU RN, Post -op Vital signs reviewed and stable and Patient moving all extremities X 4  Post vital signs: Reviewed and stable  Complications: No apparent anesthesia complications

## 2011-04-21 NOTE — Anesthesia Preprocedure Evaluation (Addendum)
Anesthesia Evaluation  Patient identified by MRN, date of birth, ID band Patient awake    Reviewed: Allergy & Precautions, H&P , NPO status , Patient's Chart, lab work & pertinent test results, reviewed documented beta blocker date and time   Airway Mallampati: II TM Distance: >3 FB Neck ROM: full    Dental No notable dental hx. (+) Teeth Intact and Dental Advisory Given   Pulmonary neg pulmonary ROS, shortness of breath and with exertion,  breath sounds clear to auscultation  Pulmonary exam normal       Cardiovascular Exercise Tolerance: Good negative cardio ROS  Rhythm:regular Rate:Normal     Neuro/Psych negative neurological ROS  negative psych ROS   GI/Hepatic negative GI ROS, Neg liver ROS, GERD-  Controlled,  Endo/Other  negative endocrine ROS  Renal/GU negative Renal ROS  negative genitourinary   Musculoskeletal   Abdominal   Peds  Hematology negative hematology ROS (+)   Anesthesia Other Findings   Reproductive/Obstetrics negative OB ROS                          Anesthesia Physical Anesthesia Plan  ASA: II  Anesthesia Plan: General   Post-op Pain Management:    Induction: Intravenous  Airway Management Planned: Oral ETT  Additional Equipment:   Intra-op Plan:   Post-operative Plan: Extubation in OR  Informed Consent: I have reviewed the patients History and Physical, chart, labs and discussed the procedure including the risks, benefits and alternatives for the proposed anesthesia with the patient or authorized representative who has indicated his/her understanding and acceptance.   Dental Advisory Given  Plan Discussed with: CRNA and Surgeon  Anesthesia Plan Comments:         Anesthesia Quick Evaluation

## 2011-04-21 NOTE — Anesthesia Postprocedure Evaluation (Signed)
  Anesthesia Post-op Note  Patient: Daniel Morales  Procedure(s) Performed: Procedure(s) (LRB): LAPAROSCOPIC CHOLECYSTECTOMY WITH INTRAOPERATIVE CHOLANGIOGRAM (N/A)  Patient Location: PACU  Anesthesia Type: General  Level of Consciousness: awake and alert   Airway and Oxygen Therapy: Patient Spontanous Breathing  Post-op Pain: mild  Post-op Assessment: Post-op Vital signs reviewed, Patient's Cardiovascular Status Stable, Respiratory Function Stable, Patent Airway and No signs of Nausea or vomiting  Post-op Vital Signs: stable  Complications: No apparent anesthesia complications

## 2011-04-21 NOTE — Progress Notes (Signed)
1 Day Post-Op  Subjective: OK after ERCP Afebrile, VSS, No labs, NPO Objective: Vital signs in last 24 hours: Temp:  [97.3 F (36.3 C)-98.7 F (37.1 C)] 98.1 F (36.7 C) (03/20 0558) Pulse Rate:  [67-107] 69  (03/20 0558) Resp:  [13-23] 19  (03/20 0558) BP: (107-163)/(56-111) 107/69 mmHg (03/20 0558) SpO2:  [97 %-100 %] 98 % (03/20 0558) Weight:  [75.161 kg (165 lb 11.2 oz)-79.379 kg (175 lb)] 75.161 kg (165 lb 11.2 oz) (03/19 2232) Last BM Date: 04/16/11  Intake/Output from previous day: 03/19 0701 - 03/20 0700 In: 2106.7 [I.V.:2006.7; IV Piggyback:100] Out: -  Intake/Output this shift:    General appearance: alert, cooperative and no distress Resp: clear to auscultation bilaterally GI: {Abd: soft, a bit sore, thirsty, no significant tenderness  Lab Results:   [facility] 04/21/11 0515 04/20/11 0634  WBC 4.2 4.3  HGB 13.4 14.8  HCT 40.1 44.6  PLT 102* 127*    BMET  Basename 04/21/11 0515 04/20/11 0634  NA 138 139  K 3.7 3.7  CL 102 100  CO2 28 29  GLUCOSE 107* 85  BUN 8 8  CREATININE 1.12 1.10  CALCIUM 9.1 9.7   PT/INR No results found for this basename: LABPROT:2,INR:2 in the last 72 hours   Lab 04/21/11 0515 04/20/11 0634 04/19/11 2135  AST 67* 158* 240*  ALT 143* 244* 263*  ALKPHOS 229* 256* 228*  BILITOT 2.8* 7.4* 4.7*  PROT 6.5 7.7 7.6  ALBUMIN 3.3* 4.2 4.0     Lipase     Component Value Date/Time   LIPASE 19 04/19/2011 2135     Studies/Results: US Abdomen Complete  04/20/2011  *RADIOLOGY REPORT*  Clinical Data:  Right upper quadrant abdominal pain and elevated LFTs.  ABDOMINAL ULTRASOUND COMPLETE  Comparison:  None  Findings:  Gallbladder:  There is mild gallbladder wall thickening, measuring 0.3 cm.  Multiple prominent layering stones are seen within the gallbladder, with several stones noted at the neck.  There is question of stones at the cystic duct, though this is not well characterized.  No definite ultrasonographic Murphy's sign is  elicited; the patient's pain is centered under the xiphoid process.  There is no evidence of gallbladder dilatation to suggest obstruction at the cystic duct.  No pericholecystic fluid is seen.  Common Bile Duct:  0.8 cm in diameter; dilated, raising question for distal obstruction.  Liver:  Normal parenchymal echogenicity and echotexture; no focal lesions identified. There is suggestion of mild intrahepatic biliary ductal dilatation.  Limited Doppler evaluation demonstrates normal blood flow within the liver.  IVC:  Unremarkable in appearance.  Pancreas:  Although the pancreas is difficult to visualize in its entirety due to overlying bowel gas, no focal pancreatic abnormality is identified.  Spleen:  9.1 cm in length; within normal limits in size and echotexture.  Right kidney:  10.2 cm in length; normal in size, configuration and parenchymal echogenicity.  No evidence of mass or hydronephrosis.  Left kidney:  10.2 cm in length; normal in size, configuration and parenchymal echogenicity.  No evidence of mass or hydronephrosis.  Abdominal Aorta:  Normal in caliber; no aneurysm identified.  IMPRESSION:  1.  Mild dilatation of the common hepatic duct to 0.8 cm, with suggestion of mild intrahepatic biliary ductal dilatation, raising question for distal obstruction. 2.  Prominent stones noted layering dependently within the gallbladder.  Stones noted at the neck, with question of stones at the cystic duct.  Mild associated gallbladder wall thickening seen, but no pericholecystic  fluid or ultrasonographic Murphy's sign is elicited.  No evidence of gallbladder dilatation to suggest obstruction at the cystic duct.  Original Report Authenticated By: Tonia Ghent, M.D.   Dg Ercp With Sphincterotomy  04/20/2011  *RADIOLOGY REPORT*  Clinical Data: Common bile duct stones.  ERCP  Comparison:  Ultrasound 04/19/2011  Technique:  Multiple spot images obtained with the fluoroscopic device and submitted for interpretation  post-procedure.  ERCP was performed by Dr. Ewing Schlein.  Findings: Cannulization and opacification of the common bile duct demonstrates multiple filling defects suggestive for common bile duct stones.  Mild dilatation of the biliary system.  There is a round structure containing contrast which could represent gallbladder or a portion of the duodenum. This structure is indeterminate on these images.  IMPRESSION: Multiple filling defects in the common bile duct are consistent with choledocholithiasis.  These images were submitted for radiologic interpretation only. Please see the procedural report for the amount of contrast and the fluoroscopy time utilized.  Original Report Authenticated By: Richarda Overlie, M.D.    Medications:    . pantoprazole (PROTONIX) IV  40 mg Intravenous QHS  . piperacillin-tazobactam (ZOSYN)  IV  3.375 g Intravenous Q8H    Assessment/Plan Symptomatic cholelithiasis versus acute cholecystitis s/p ERCP, with removal of multiple stones/spincterotomy 04/20/11 DR. Magod Choledocholithiasis  Urinary tract infection   Plan:  LFT's improving.  Will discuss surgery, I have gone thru risk with surgery.    LOS: 2 days    Zienna Ahlin 04/21/2011

## 2011-04-21 NOTE — ED Provider Notes (Signed)
Medical screening examination/treatment/procedure(s) were conducted as a shared visit with non-physician practitioner(s) and myself.  I personally evaluated the patient during the encounter  C/O intermittent abdominal pain x months, now constant. +Epigastric/RUQ pain. Transaminitis, elevated AP, and total bili. RUQ U/S pending on my signout. Apparently, admitted to gen surgery for choledocholithiasis by NP/ Dr. Nicanor Alcon.  Forbes Cellar, MD 04/21/11 [phone]

## 2011-04-22 ENCOUNTER — Encounter (HOSPITAL_COMMUNITY): Payer: Self-pay | Admitting: Certified Registered Nurse Anesthetist

## 2011-04-22 NOTE — Progress Notes (Signed)
1 Day Post-Op  Subjective: Tolerating some clear liquids. Not passing much flatus, but reports he is getting hungry and wants to try some regular food later today.   Objective: Vital signs in last 24 hours: Temp:  [97.1 F (36.2 C)-98.8 F (37.1 C)] 97.1 F (36.2 C) (03/21 0625) Pulse Rate:  [63-86] 68  (03/21 0625) Resp:  [16-24] 18  (03/21 0625) BP: (94-145)/(60-88) 94/62 mmHg (03/21 0625) SpO2:  [96 %-100 %] 96 % (03/21 0625) Last BM Date: 04/16/11  Intake/Output from previous day: 03/20 0701 - 03/21 0700 In: 1250 [I.V.:1250] Out: 470 [Urine:450; Blood:20] Intake/Output this shift: Total I/O In: 2241 [P.O.:240; I.V.:2001] Out: -   General appearance: alert, cooperative, appears stated age and no distress Resp: clear to auscultation bilaterally Cardio: regular rate and rhythm GI: +BS, mildly distended, but soft overall, mildly tender  Lab Results:   Basename 04/21/11 0515 04/20/11 0634  WBC 4.2 4.3  HGB 13.4 14.8  HCT 40.1 44.6  PLT 102* 127*   BMET  Basename 04/21/11 0515 04/20/11 0634  NA 138 139  K 3.7 3.7  CL 102 100  CO2 28 29  GLUCOSE 107* 85  BUN 8 8  CREATININE 1.12 1.10  CALCIUM 9.1 9.7   PT/INR No results found for this basename: LABPROT:2,INR:2 in the last 72 hours ABG No results found for this basename: PHART:2,PCO2:2,PO2:2,HCO3:2 in the last 72 hours  Studies/Results: Dg Cholangiogram Operative  04/21/2011  *RADIOLOGY REPORT*  Clinical Data: Cholecystitis.  Cholecystectomy.  INTRAOPERATIVE CHOLANGIOGRAM  Comparison:  ERCP 03/19  Findings: Contrast is injected to the cystic duct remnant.  There is good filling of the main intrahepatic ducts, the extrahepatic ductal system and demonstration of free flow into the duodenum.  No filling defects are seen to suggest retained stone.  IMPRESSION: Negative operative cholangiogram.  No evidence of retained stone.  Original Report Authenticated By: Thomasenia Sales, M.D.     Anti-infectives: Anti-infectives     Start     Dose/Rate Route Frequency Ordered Stop   04/20/11 1200   piperacillin-tazobactam (ZOSYN) IVPB 3.375 g        3.375 g 12.5 mL/hr over 240 Minutes Intravenous Every 8 hours 04/20/11 0702     04/20/11 0315   piperacillin-tazobactam (ZOSYN) IVPB 3.375 g        3.375 g 100 mL/hr over 30 Minutes Intravenous  Once 04/20/11 0246 04/20/11 0410          Assessment/Plan: s/p Procedure(s) (LRB): LAPAROSCOPIC CHOLECYSTECTOMY WITH INTRAOPERATIVE CHOLANGIOGRAM (N/A) Advance diet Plan for discharge tomorrow Pt is wondering how long he may need to be out of work as he Engineer, 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    LOS: 3 days    Shabnam Ladd 04/22/2011

## 2011-04-23 LAB — CBC
Hemoglobin: 13.4 g/dL (ref 13.0–17.0)
MCH: 28 pg (ref 26.0–34.0)
MCV: 85.8 fL (ref 78.0–100.0)
RBC: 4.78 MIL/uL (ref 4.22–5.81)

## 2011-04-23 LAB — COMPREHENSIVE METABOLIC PANEL
ALT: 93 U/L — ABNORMAL HIGH (ref 0–53)
AST: 41 U/L — ABNORMAL HIGH (ref 0–37)
Albumin: 3.7 g/dL (ref 3.5–5.2)
Calcium: 9.3 mg/dL (ref 8.4–10.5)
Chloride: 101 mEq/L (ref 96–112)
Creatinine, Ser: 1.04 mg/dL (ref 0.50–1.35)
Sodium: 137 mEq/L (ref 135–145)
Total Bilirubin: 1.2 mg/dL (ref 0.3–1.2)

## 2011-04-23 LAB — DIFFERENTIAL
Basophils Absolute: 0 10*3/uL (ref 0.0–0.1)
Basophils Relative: 0 % (ref 0–1)
Monocytes Absolute: 0.4 10*3/uL (ref 0.1–1.0)
Neutro Abs: 3.3 10*3/uL (ref 1.7–7.7)
Neutrophils Relative %: 62 % (ref 43–77)

## 2011-04-23 MED ORDER — PANTOPRAZOLE SODIUM 40 MG PO TBEC: 40.0000 mg | DELAYED_RELEASE_TABLET | Freq: Every day | ORAL | Status: DC

## 2011-04-23 MED ORDER — ACETAMINOPHEN 325 MG PO TABS: 650.0000 mg | ORAL_TABLET | Freq: Four times a day (QID) | ORAL | Status: AC | PRN

## 2011-04-23 MED ORDER — ACETAMINOPHEN 325 MG PO TABS: 650.0000 mg | ORAL_TABLET | Freq: Four times a day (QID) | ORAL | Status: DC | PRN

## 2011-04-23 MED ORDER — OXYCODONE-ACETAMINOPHEN 5-325 MG PO TABS
1.0000 | ORAL_TABLET | ORAL | Status: AC | PRN
Start: 2011-04-23 — End: 2011-05-03

## 2011-04-23 NOTE — Progress Notes (Signed)
Daniel Morales 9:36 AM  Subjective: Patient is doing well postop and ate most of his breakfast and has a different type of discomfort from his usual pain and his procedure was discussed with the surgeon  Objective: Vital signs stable afebrile patient looks good no new labs Intra-Op cholangiogram reviewed  Assessment: Status post ERCP and laparoscopic cholecystectomy  Plan: Happy to see back when necessary otherwise he should be discharged today per surgery team and will go ahead and repeat liver tests prior to discharge to confirm they're on their way back to normal [facility] E

## 2011-04-23 NOTE — Progress Notes (Signed)
The patient is receiving Protonix by the intravenous route.  Based on criteria approved by the Pharmacy and Therapeutics Committee and the Medical Executive Committee, the medication is being converted to the equivalent oral dose form.  These criteria include: -No Active GI bleeding -Able to tolerate diet of full liquids (or better) or tube feeding -Able to tolerate other medications by the oral or enteral route  If you have any questions about this conversion, please contact the Pharmacy Department at [phone]  Clydene Fake PharmD 10:31 AM 04/23/2011

## 2011-04-23 NOTE — Progress Notes (Signed)
2 Days Post-Op  Subjective: Feels good taking regular diet.  Objective: Vital signs in last 24 hours: Temp:  [98.7 F (37.1 C)-99.7 F (37.6 C)] 98.7 F (37.1 C) (03/22 0930) Pulse Rate:  [78-84] 84  (03/22 0930) Resp:  [19-20] 19  (03/22 0930) BP: (120-147)/(72-95) 126/74 mmHg (03/22 0930) SpO2:  [94 %-100 %] 95 % (03/22 0930) Last BM Date: 04/16/11  Intake/Output from previous day: 03/21 0701 - 03/22 0700 In: 2291 [P.O.:240; I.V.:2001; IV Piggyback:50] Out: -  Intake/Output this shift: Total I/O In: 480 [P.O.:480] Out: -   General appearance: alert, cooperative and no distress GI: soft, non-tender; bowel sounds normal; no masses,  no organomegaly and incisions look good  Lab Results:   Basename 04/23/11 1008 04/21/11 0515  WBC 5.2 4.2  HGB 13.4 13.4  HCT 41.0 40.1  PLT 111* 102*    BMET  Basename 04/23/11 1008 04/21/11 0515  NA 137 138  K 3.4* 3.7  CL 101 102  CO2 31 28  GLUCOSE 118* 107*  BUN 5* 8  CREATININE 1.04 1.12  CALCIUM 9.3 9.1   PT/INR No results found for this basename: LABPROT:2,INR:2 in the last 72 hours   Lab 04/23/11 1008 04/21/11 0515 04/20/11 0634 04/19/11 2135  AST 41* 67* 158* 240*  ALT 93* 143* 244* 263*  ALKPHOS 180* 229* 256* 228*  BILITOT 1.2 2.8* 7.4* 4.7*  PROT 7.5 6.5 7.7 7.6  ALBUMIN 3.7 3.3* 4.2 4.0     Lipase     Component Value Date/Time   LIPASE 2555* 04/21/2011 0515     Studies/Results: Dg Cholangiogram Operative  04/21/2011  *RADIOLOGY REPORT*  Clinical Data: Cholecystitis.  Cholecystectomy.  INTRAOPERATIVE CHOLANGIOGRAM  Comparison:  ERCP 03/19  Findings: Contrast is injected to the cystic duct remnant.  There is good filling of the main intrahepatic ducts, the extrahepatic ductal system and demonstration of free flow into the duodenum.  No filling defects are seen to suggest retained stone.  IMPRESSION: Negative operative cholangiogram.  No evidence of retained stone.  Original Report Authenticated By: Thomasenia Sales, M.D.    Medications:    . heparin  5,000 Units Subcutaneous Q8H  . pantoprazole  40 mg Oral QHS  . piperacillin-tazobactam (ZOSYN)  IV  3.375 g Intravenous Q8H  . DISCONTD: pantoprazole (PROTONIX) IV  40 mg Intravenous QHS    Assessment/Plan s/p Procedure(s) (LRB):  LAPAROSCOPIC CHOLECYSTECTOMY WITH INTRAOPERATIVE CHOLANGIOGRAM (N/A) 04/21/11 Dr. Daphine Deutscher. 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   Plan:  Home today.  LOS: 4 days    Keelie Zemanek 04/23/2011

## 2011-04-23 NOTE — Discharge Instructions (Signed)
Laparoscopic Cholecystectomy Laparoscopic cholecystectomy is surgery to remove the gallbladder. The gallbladder is located slightly to the right of center in the abdomen, behind the liver. It is a concentrating and storage sac for the bile produced in the liver. Bile aids in the digestion and absorption of fats. Gallbladder disease (cholecystitis) is an inflammation of your gallbladder. This condition is usually caused by a buildup of gallstones (cholelithiasis) in your gallbladder. Gallstones can block the flow of bile, resulting in inflammation and pain. In severe cases, emergency surgery may be required. When emergency surgery is not required, you will have time to prepare for the procedure. Laparoscopic surgery is an alternative to open surgery. Laparoscopic surgery usually has a shorter recovery time. Your common bile duct may also need to be examined and explored. Your caregiver will discuss this with you if he or she feels this should be done. If stones are found in the common bile duct, they may be removed. LET YOUR CAREGIVER KNOW ABOUT:  Allergies to food or medicine.   Medicines taken, including vitamins, herbs, eyedrops, over-the-counter medicines, and creams.   Use of steroids (by mouth or creams).   Previous problems with anesthetics or numbing medicines.   History of bleeding problems or blood clots.   Previous surgery.   Other health problems, including diabetes and kidney problems.   Possibility of pregnancy, if this applies.  RISKS AND COMPLICATIONS All surgery is associated with risks. Some problems that may occur following this procedure include:  Infection.   Damage to the common bile duct, nerves, arteries, veins, or other internal organs such as the stomach or intestines.   Bleeding.   A stone may remain in the common bile duct.  BEFORE THE PROCEDURE  Do not take aspirin for 3 days prior to surgery or blood thinners for 1 week prior to surgery.   Do not eat or  drink anything after midnight the night before surgery.   Let your caregiver know if you develop a cold or other infectious problem prior to surgery.   You should be present 60 minutes before the procedure or as directed.  PROCEDURE  You will be given medicine that makes you sleep (general anesthetic). When you are asleep, your surgeon will make several small cuts (incisions) in your abdomen. One of these incisions is used to insert a small, lighted scope (laparoscope) into the abdomen. The laparoscope helps the surgeon see into your abdomen. Carbon dioxide gas will be pumped into your abdomen. The gas allows more room for the surgeon to perform your surgery. Other operating instruments are inserted through the other incisions. Laparoscopic procedures may not be appropriate when:  There is major scarring from previous surgery.   The gallbladder is extremely inflamed.   There are bleeding disorders or unexpected cirrhosis of the liver.   A pregnancy is near term.   Other conditions make the laparoscopic procedure impossible.  If your surgeon feels it is not safe to continue with a laparoscopic procedure, he or she will perform an open abdominal procedure. In this case, the surgeon will make an incision to open the abdomen. This gives the surgeon a larger view and field to work within. This may allow the surgeon to perform procedures that sometimes cannot be performed with a laparoscope alone. Open surgery has a longer recovery time. AFTER THE PROCEDURE  You will be taken to the recovery area where a nurse will watch and check your progress.   You may be allowed to go home   the same day.   Do not resume physical activities until directed by your caregiver.   You may resume a normal diet and activities as directed.  Document Released: 01/18/2005 Document Revised: 01/07/2011 Document Reviewed: 07/03/2010 ExitCare Patient Information CCS ______[facility] LAPAROSCOPIC  SURGERY: POST OP INSTRUCTIONS Always review your discharge instruction sheet given to you by the facility where your surgery was performed. IF YOU HAVE DISABILITY OR FAMILY LEAVE FORMS, YOU MUST BRING THEM TO THE OFFICE FOR PROCESSING.   DO NOT GIVE THEM TO YOUR DOCTOR.  1. A prescription for pain medication may be given to you upon discharge.  Take your pain medication as prescribed, if needed.  If narcotic pain medicine is not needed, then you may take acetaminophen (Tylenol) or ibuprofen (Advil) as needed. 2. Take your usually prescribed medications unless otherwise directed. 3. If you need a refill on your pain medication, please contact your pharmacy.  They will contact our office to request authorization. Prescriptions will not be filled after 5pm or on week-ends. 4. You should follow a light diet the first few days after arrival home, such as soup and crackers, etc.  Be sure to include lots of fluids daily. 5. Most patients will experience some swelling and bruising in the area of the incisions.  Ice packs will help.  Swelling and bruising can take several days to resolve.  6. It is common to experience some constipation if taking pain medication after surgery.  Increasing fluid intake and taking a stool softener (such as Colace) will usually help or prevent this problem from occurring.  A mild laxative (Milk of Magnesia or Miralax) should be taken according to package instructions if there are no bowel movements after 48 hours. 7. Unless discharge instructions indicate otherwise, you may remove your bandages 24-48 hours after surgery, and you may shower at that time.  You may have steri-strips (small skin tapes) in place directly over the incision.  These strips should be left on the skin for 7-10 days.  If your surgeon used skin glue on the incision, you may shower in 24 hours.  The glue will flake off over the next 2-3 weeks.  Any sutures or staples will be removed at the office during your  follow-up visit. 8. ACTIVITIES:  You may resume regular (light) daily activities beginning the next day--such as daily self-care, walking, climbing stairs--gradually increasing activities as tolerated.  You may have sexual intercourse when it is comfortable.  Refrain from any heavy lifting or straining until approved by your doctor. a. You may drive when you are no longer taking prescription pain medication, you can comfortably wear a seatbelt, and you can safely maneuver your car and apply brakes. b. RETURN TO WORK:  __________________________________________________________ 9. You should see your doctor in the office for a follow-up appointment approximately 2-3 weeks after your surgery.  Make sure that you call for this appointment within a day or two after you arrive home to insure a convenient appointment time. 10. OTHER INSTRUCTIONS: __________________________________________________________________________________________________________________________ __________________________________________________________________________________________________________________________ WHEN TO CALL YOUR DOCTOR: 1. Fever over 101.0 2. Inability to urinate 3. Continued bleeding from incision. 4. Increased pain, redness, or drainage from the incision. 5. Increasing abdominal pain  The clinic staff is available to answer your questions during regular business hours.  Please don't hesitate to call and ask to speak to one of the nurses for clinical concerns.  If you have a medical emergency, go to the nearest emergency room or call 911.  A  surgeon from [facility] is always on call at the hospital. [address], [place], [place]  [address] ? [address] [address], [place], [place]   [address] [phone] ? [phone] ? FAX [phone] Web site: [email or web address] [employer]

## 2011-04-23 NOTE — Progress Notes (Signed)
Discharge instructions given to pt, verbalized understanding. Left the unit in stable condition. 

## 2011-04-23 NOTE — Discharge Summary (Signed)
Physician Discharge Summary  Patient ID: Daniel Morales MRN: [id number] DOB/AGE: Sep 03, 1970 41 y.o. y.o.  Admit date: 04/19/2011 Discharge date: 04/23/2011  Admission Diagnoses: Symptomatic cholelithiasis versus acute cholecystitis  Choledocholithiasis    Discharge Diagnoses: Same Active Problems:  Symptomatic cholelithiasis  Choledocholithiasis   PROCEDURES:LAPAROSCOPIC CHOLECYSTECTOMY WITH INTRAOPERATIVE CHOLANGIOGRAM (N/A) 04/21/11 Dr. Daphine Deutscher.  ERCP with balloon passage, ERCP with removal of  stones, ERCP with sphincterotomy 04/20/11 DR. [facility] Course:  41 year old African American male comes to the ED complaining of several days of crampy upper abdominal pain. He has had this abdominal pain on and off for about a year. It will generally last several days and then resolved. He describes it as crampy pain in his upper abdomen and a sensation of feeling full in his lower abdomen. It is generally associated with nausea and some occasional emesis. He denies any jaundice or pruritus. He denies any acholic stools. He denies any pain or difficulty swallowing. The most recent episode started about 2 days ago. His discomfort has been constant. He reports a decreased appetite since Saturday. He reports no bowel movement since Friday. It is normal for him to have one bowel movement every 2-3 days. He has tried taking a stool softener for relief which was unsuccessful. He denies any personal or family history of cancer. He states that he hasn't had this checked out before because of insurance and job reasons. He denies any fevers or chills. Denies any melena or hematochezia. He denies any hematemesis.  Pt was seen by GI Dr. Ewing Schlein and had ERCP, he then underwent Cholecystectomy.  Labs have improved He is tolerating a full diet, his incisions look good and we plan D/C today.  Condition on d/c:  Improved   Lab 04/23/11 1008 04/21/11 0515 04/20/11 0634 04/19/11 2135  AST 41* 67* 158* 240*  ALT  93* 143* 244* 263*  ALKPHOS 180* 229* 256* 228*  BILITOT 1.2 2.8* 7.4* 4.7*  PROT 7.5 6.5 7.7 7.6  ALBUMIN 3.7 3.3* 4.2 4.0    Lipase     Component Value Date/Time   LIPASE 2555* 04/21/2011 0515   CBC    Component Value Date/Time   WBC 5.2 04/23/2011 1008   RBC 4.78 04/23/2011 1008   HGB 13.4 04/23/2011 1008   HCT 41.0 04/23/2011 1008   PLT 111* 04/23/2011 1008   MCV 85.8 04/23/2011 1008   MCH 28.0 04/23/2011 1008   MCHC 32.7 04/23/2011 1008   RDW 13.2 04/23/2011 1008   LYMPHSABS 1.4 04/23/2011 1008   MONOABS 0.4 04/23/2011 1008   EOSABS 0.2 04/23/2011 1008   BASOSABS 0.0 04/23/2011 1008           Disposition: Final discharge disposition not confirmed   Medication List  As of 04/23/2011  1:10 PM   STOP taking these medications         bisacodyl 5 MG EC tablet      cetirizine 10 MG chewable tablet      diphenhydrAMINE 25 MG tablet      senna 8.6 MG tablet         TAKE these medications         acetaminophen 325 MG tablet   Commonly known as: TYLENOL   Take 2 tablets (650 mg total) by mouth every 6 (six) hours as needed.      ibuprofen 200 MG tablet   Commonly known as: ADVIL,MOTRIN   Take 200 mg by mouth every 6 (six) hours as needed. For pain relief  oxyCODONE-acetaminophen 5-325 MG per tablet   Commonly known as: PERCOCET   Take 1-2 tablets by mouth every 4 (four) hours as needed for pain.           Follow-up Information    Follow up with Luretha Murphy B, MD. Schedule an appointment as soon as possible for a visit in 2 weeks.   Contact information:   [employer] [address], [address] [place] [address] [phone]          Signed: Sherrie George 04/23/2011, 1:10 PM

## 2011-04-26 ENCOUNTER — Telehealth (INDEPENDENT_AMBULATORY_CARE_PROVIDER_SITE_OTHER): Payer: Self-pay | Admitting: General Surgery

## 2011-04-26 NOTE — Telephone Encounter (Signed)
Pt calling in for 2 week post op appt with Dr. Daphine Deutscher.  Please advise him.

## 2011-04-27 ENCOUNTER — Telehealth (INDEPENDENT_AMBULATORY_CARE_PROVIDER_SITE_OTHER): Payer: Self-pay | Admitting: General Surgery

## 2011-04-27 NOTE — Telephone Encounter (Signed)
Left a message on the AM for Daniel Morales that his appt is 05/07/11 @9 :00 he will need to be here at 8:45

## 2011-04-29 MED FILL — Glucagon HCl (rDNA) For Inj 1 MG (Base Equiv): INTRAMUSCULAR | Qty: 2 | Status: AC

## 2011-04-29 MED FILL — Diphenhydramine HCl Inj 50 MG/ML: INTRAMUSCULAR | Qty: 1 | Status: AC

## 2011-04-30 ENCOUNTER — Encounter (HOSPITAL_COMMUNITY): Payer: Self-pay | Admitting: Surgery

## 2011-05-06 ENCOUNTER — Ambulatory Visit (INDEPENDENT_AMBULATORY_CARE_PROVIDER_SITE_OTHER): Payer: 59 | Admitting: Surgery

## 2011-05-06 ENCOUNTER — Encounter (INDEPENDENT_AMBULATORY_CARE_PROVIDER_SITE_OTHER): Payer: Self-pay | Admitting: Surgery

## 2011-05-06 VITALS — BP 124/88 | HR 72 | Temp 97.8°F | Resp 14 | Ht 70.0 in | Wt 172.0 lb

## 2011-05-06 DIAGNOSIS — Z9049 Acquired absence of other specified parts of digestive tract: Secondary | ICD-10-CM

## 2011-05-06 DIAGNOSIS — Z9889 Other specified postprocedural states: Secondary | ICD-10-CM

## 2011-05-06 NOTE — Progress Notes (Signed)
Daniel Morales 41 y.o.  Body mass index is 24.68 kg/(m^2).  Patient Active Problem List  Diagnoses  . Symptomatic cholelithiasis  . Choledocholithiasis    No Known Allergies  Past Surgical History  Procedure Date  . Circumcision   . Ercp 04/20/2011    Procedure: ENDOSCOPIC RETROGRADE CHOLANGIOPANCREATOGRAPHY (ERCP);  Surgeon: Petra Kuba, MD;  Location: [facility];  Service: Endoscopy;  Laterality: N/A;  . Cholecystectomy 04/21/2011    Procedure: LAPAROSCOPIC CHOLECYSTECTOMY WITH INTRAOPERATIVE CHOLANGIOGRAM;  Surgeon: Valarie Merino, MD;  Location: [facility];  Service: General;  Laterality: N/A;   Provider Not In System No diagnosis found.  Post op lap chole: Severe sigmoid shaped fibrotic gallbladder with multiple yellow mullberry stones. Normal IOC   Incsions OK.  Doing well Return prn.  Matt B. Daphine Deutscher, MD, [facility] [phone] beeper [phone]  05/06/2011 9:36 AM

## 2013-02-05 IMAGING — RF DG CHOLANGIOGRAM OPERATIVE
1 series · 4 of 4 positions shown · non-contrast
Comparison: ERCP [DATE]

CLINICAL DATA: Cholecystitis.  Cholecystectomy.

INTRAOPERATIVE CHOLANGIOGRAM

[Series 1: run · 4 of 112 frames shown]
[frame 17/112]
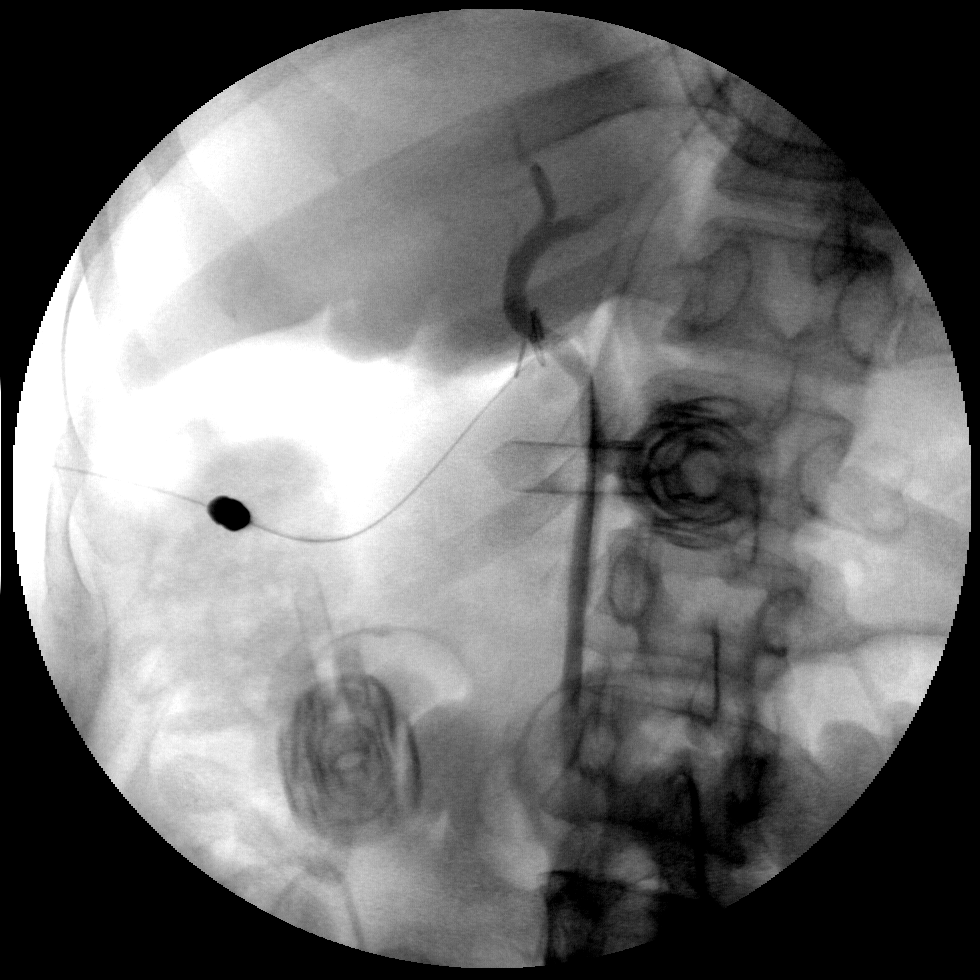
[frame 43/112]
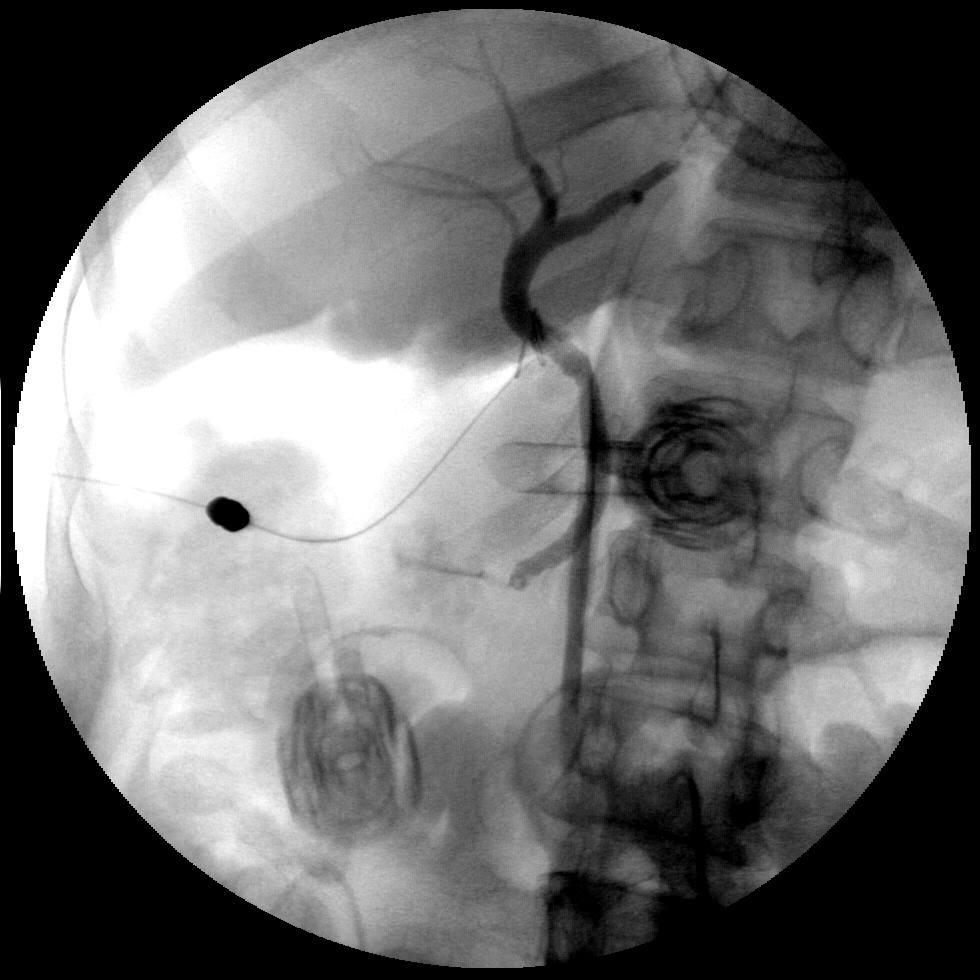
[frame 57/112]
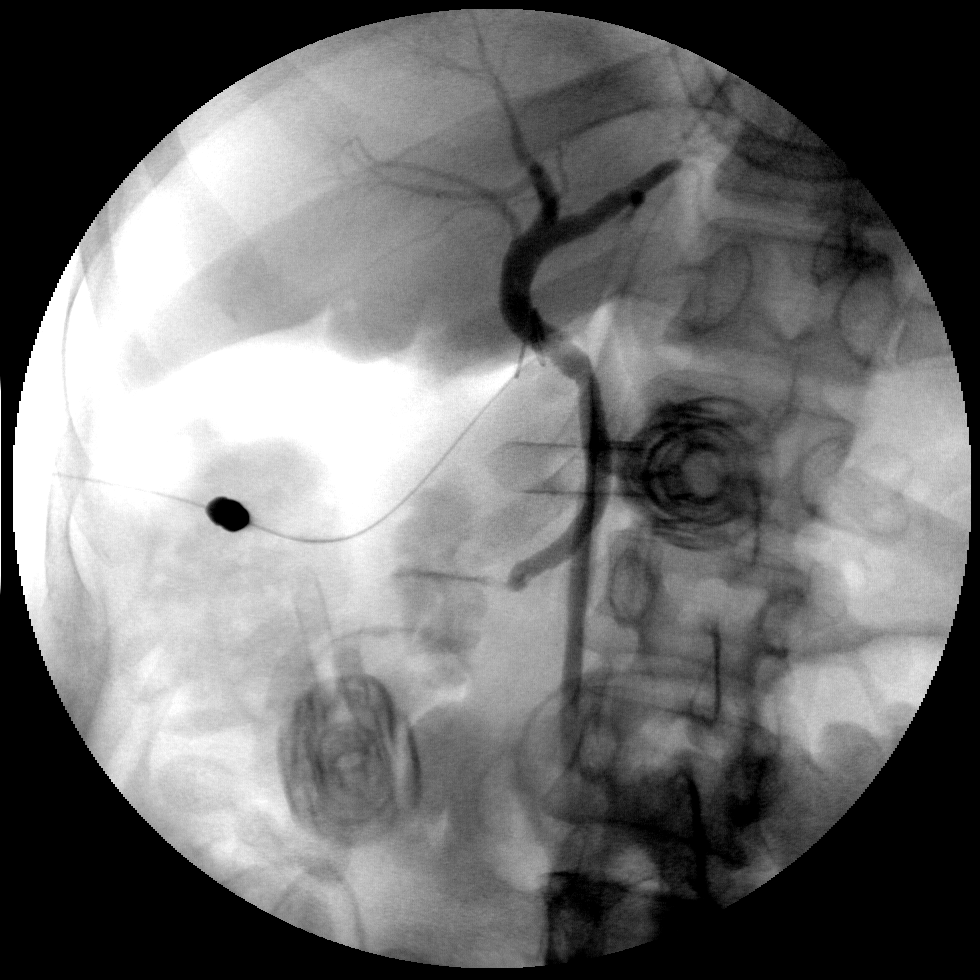
[frame 96/112]
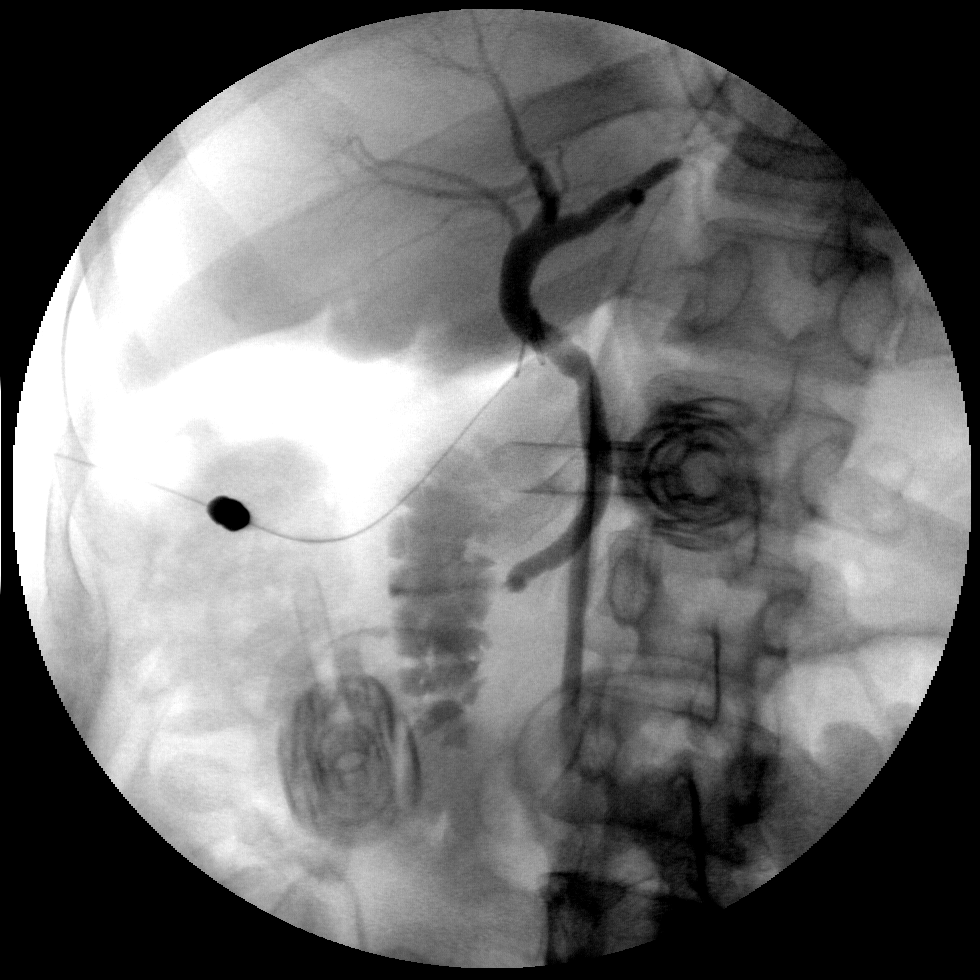

[4 of 4 positions shown; findings below may reference images not displayed]

FINDINGS: Contrast is injected to the cystic duct remnant.  There
is good filling of the main intrahepatic ducts, the extrahepatic
ductal system and demonstration of free flow into the duodenum.  No
filling defects are seen to suggest retained stone.
IMPRESSION: Negative operative cholangiogram.  No evidence of retained stone.

## 2015-02-07 ENCOUNTER — Encounter (HOSPITAL_COMMUNITY): Payer: Self-pay

## 2015-02-07 ENCOUNTER — Emergency Department (HOSPITAL_COMMUNITY)
Admission: EM | Admit: 2015-02-07 | Discharge: 2015-02-07 | Disposition: A | Discharge status: Home / Self Care | Payer: 59 | Attending: Emergency Medicine | Admitting: Emergency Medicine

## 2015-02-07 ENCOUNTER — Emergency Department (HOSPITAL_COMMUNITY): Payer: 59

## 2015-02-07 DIAGNOSIS — Z87891 Personal history of nicotine dependence: Secondary | ICD-10-CM | POA: Diagnosis not present

## 2015-02-07 DIAGNOSIS — K5900 Constipation, unspecified: Secondary | ICD-10-CM | POA: Diagnosis not present

## 2015-02-07 DIAGNOSIS — Z9049 Acquired absence of other specified parts of digestive tract: Secondary | ICD-10-CM | POA: Insufficient documentation

## 2015-02-07 HISTORY — DX: Constipation, unspecified: K59.00

## 2015-02-07 LAB — I-STAT CHEM 8, ED
BUN: 12 mg/dL (ref 6–20)
CHLORIDE: 101 mmol/L (ref 101–111)
CREATININE: 0.9 mg/dL (ref 0.61–1.24)
Calcium, Ion: 1.17 mmol/L (ref 1.12–1.23)
Glucose, Bld: 73 mg/dL (ref 65–99)
HEMATOCRIT: 47 % (ref 39.0–52.0)
Hemoglobin: 16 g/dL (ref 13.0–17.0)
Potassium: 4.1 mmol/L (ref 3.5–5.1)
Sodium: 140 mmol/L (ref 135–145)
TCO2: 26 mmol/L (ref 0–100)

## 2015-02-07 MED ORDER — POLYETHYLENE GLYCOL 3350 17 G PO PACK: 17.0000 g | PACK | Freq: Two times a day (BID) | ORAL | Status: DC | PRN

## 2015-02-07 MED ORDER — DOCUSATE SODIUM 100 MG PO CAPS: 100.0000 mg | ORAL_CAPSULE | Freq: Two times a day (BID) | ORAL | Status: DC

## 2015-02-07 MED ORDER — MAGNESIUM HYDROXIDE 400 MG/5ML PO SUSP: 15.0000 mL | Freq: Every day | ORAL | Status: DC | PRN

## 2015-02-07 MED ORDER — LIDOCAINE (ANORECTAL) 5 % EX CREA: TOPICAL_CREAM | CUTANEOUS | Status: DC

## 2015-02-07 NOTE — ED Notes (Addendum)
Pt c/o constipation, low back pain, and rectal pain x "1 month."  Sts bowel movements are very large and painful to pass.  Sts he cannot take the pain, so he will not allow himself to have a BM.  Pt was seen at Urgent Care yesterday and prescribed Generlac.

## 2015-02-07 NOTE — ED Provider Notes (Signed)
CSN: [id number]     Arrival date & time 02/07/15  1025 History   First MD Initiated Contact with Patient 02/07/15 1120     Chief Complaint  Patient presents with  . Constipation     (Consider location/radiation/quality/duration/timing/severity/associated sxs/prior Treatment) HPI  45 year old male presents with constipation. Has had constipation since having his gallbladder taken out in 2013. Patient states that typically he doesn't go for 2 or 3 days and this is normal for him. Over the last one month he has been having progressively worsening constipation as well as very hard and painful stools. He is preventing himself going the bathroom because it hurt so bad. Occasionally has blood when he wipes. Denies any nausea or vomiting. Has been feeling some lower abdominal discomfort that he has had before when he gets constipated. Went to urgent care was prescribed Generlac, took this twice orally but no significant relief. Was told he had a hemorrhoid. No rectal pain unless he is actively going to the bathroom.  Past Medical History  Diagnosis Date  . Shortness of breath     "bc of my allergies"  . Environmental allergies     year round  . GERD (gastroesophageal reflux disease)     "heartburn at times"  . Headache(784.0)   . Constipation    Past Surgical History  Procedure Laterality Date  . Circumcision    . Ercp  04/20/2011    Procedure: ENDOSCOPIC RETROGRADE CHOLANGIOPANCREATOGRAPHY (ERCP);  Surgeon: Petra Kuba, MD;  Location: [facility];  Service: Endoscopy;  Laterality: N/A;  . Cholecystectomy  04/21/2011    Procedure: LAPAROSCOPIC CHOLECYSTECTOMY WITH INTRAOPERATIVE CHOLANGIOGRAM;  Surgeon: Valarie Merino, MD;  Location: [facility];  Service: General;  Laterality: N/A;   Family History  Problem Relation Age of Onset  . Hypertension Other   . Diabetes Other    Social History  Substance Use Topics  . Smoking status: Former Games developer  . Smokeless tobacco: Never Used  . Alcohol  Use: No    Review of Systems  Constitutional: Negative for fever.  Gastrointestinal: Positive for abdominal pain and constipation. Negative for nausea and vomiting.  All other systems reviewed and are negative.     Allergies  Review of patient's allergies indicates no known allergies.  Home Medications   Prior to Admission medications   Medication Sig Start Date End Date Taking? Authorizing Provider  ibuprofen (ADVIL,MOTRIN) 200 MG tablet Take 200 mg by mouth every 6 (six) hours as needed. For pain relief    Historical Provider, MD   BP 147/102 mmHg  Pulse 86  Temp(Src) 97.8 F (36.6 C) (Oral)  Resp 14  SpO2 100% Physical Exam  Constitutional: He is oriented to person, place, and time. He appears well-developed and well-nourished.  HENT:  Head: Normocephalic and atraumatic.  Right Ear: External ear normal.  Left Ear: External ear normal.  Nose: Nose normal.  Eyes: Right eye exhibits no discharge. Left eye exhibits no discharge.  Neck: Neck supple.  Cardiovascular: Normal rate, regular rhythm, normal heart sounds and intact distal pulses.   Pulmonary/Chest: Effort normal and breath sounds normal.  Abdominal: Soft. He exhibits no distension. There is no tenderness.  Genitourinary: Rectal exam shows external hemorrhoid (non thrombosed, pink).  Pain with rectal exam. No obvious fissures/tears. Firm stool in rectum several cm in. Patient tolerated exam poorly due to discomfort. Brown stool on finger.  Musculoskeletal: He exhibits no edema.  Neurological: He is alert and oriented to person, place, and time.  Skin:  Skin is warm and dry.  Nursing note and vitals reviewed.   ED Course  Procedures (including critical care time) Labs Review Labs Reviewed  I-STAT CHEM 8, ED    Imaging Review Dg Abd 1 View  02/07/2015  CLINICAL DATA:  Patient with chronic constipation. Worsening mid and lower abdominal pain. EXAM: ABDOMEN - 1 VIEW COMPARISON:  Ultrasound abdomen 04/19/2011.  FINDINGS: Lung bases are clear. Cholecystectomy clips. Gas is demonstrated within nondilated loops of large and small bowel in a nonobstructed pattern. Large amount of stool demonstrated throughout the colon. No free intraperitoneal air. No aggressive or acute appearing osseous lesions. IMPRESSION: Nonobstructed bowel gas pattern. Large amount stool throughout the colon as can be seen constipation. Electronically Signed   By: Annia Belt M.D.   On: 02/07/2015 12:29   I have personally reviewed and evaluated these images and lab results as part of my medical decision-making.   EKG Interpretation None      MDM   Final diagnoses:  Constipation, unspecified constipation type    Patient's presentation is consistent with constipation. Abdominal exam is nonspecific with no focal tenderness, guarding, or concerning findings I doubt CT would be needed. Patient is very tender on rectal exam and could not tolerate the full exam. He states he's been hurting due to the hard and firm bowel movements. Patient will be given a bowel regimen to go home with including some topical lidocaine for his pain. His hemorrhoid is nonthrombosed. Discussed return precautions and recommend finding a PCP and increasing water and fiber intake.    Pricilla Loveless, MD 02/07/15 [id number]

## 2016-03-08 ENCOUNTER — Encounter (HOSPITAL_COMMUNITY): Payer: Self-pay

## 2016-03-08 DIAGNOSIS — K921 Melena: Secondary | ICD-10-CM | POA: Diagnosis present

## 2016-03-08 DIAGNOSIS — G35 Multiple sclerosis: Secondary | ICD-10-CM | POA: Diagnosis not present

## 2016-03-08 DIAGNOSIS — K649 Unspecified hemorrhoids: Secondary | ICD-10-CM | POA: Diagnosis present

## 2016-03-08 DIAGNOSIS — K5900 Constipation, unspecified: Secondary | ICD-10-CM | POA: Diagnosis present

## 2016-03-08 DIAGNOSIS — R260 Ataxic gait: Secondary | ICD-10-CM | POA: Diagnosis not present

## 2016-03-08 DIAGNOSIS — D6959 Other secondary thrombocytopenia: Secondary | ICD-10-CM | POA: Diagnosis present

## 2016-03-08 DIAGNOSIS — G320 Subacute combined degeneration of spinal cord in diseases classified elsewhere: Secondary | ICD-10-CM | POA: Diagnosis present

## 2016-03-08 DIAGNOSIS — K219 Gastro-esophageal reflux disease without esophagitis: Secondary | ICD-10-CM | POA: Diagnosis present

## 2016-03-08 DIAGNOSIS — Z87891 Personal history of nicotine dependence: Secondary | ICD-10-CM

## 2016-03-08 DIAGNOSIS — D519 Vitamin B12 deficiency anemia, unspecified: Secondary | ICD-10-CM | POA: Diagnosis present

## 2016-03-08 DIAGNOSIS — D509 Iron deficiency anemia, unspecified: Secondary | ICD-10-CM | POA: Diagnosis present

## 2016-03-08 LAB — COMPREHENSIVE METABOLIC PANEL
ALT: 19 U/L (ref 17–63)
AST: 22 U/L (ref 15–41)
Albumin: 4.6 g/dL (ref 3.5–5.0)
Alkaline Phosphatase: 82 U/L (ref 38–126)
Anion gap: 9 (ref 5–15)
BUN: 13 mg/dL (ref 6–20)
CHLORIDE: 108 mmol/L (ref 101–111)
CO2: 25 mmol/L (ref 22–32)
Calcium: 9.1 mg/dL (ref 8.9–10.3)
Creatinine, Ser: 0.97 mg/dL (ref 0.61–1.24)
GFR calc Af Amer: >60 mL/min (ref >60)
Glucose, Bld: 88 mg/dL (ref 65–99)
POTASSIUM: 3.7 mmol/L (ref 3.5–5.1)
SODIUM: 142 mmol/L (ref 135–145)
Total Bilirubin: 0.9 mg/dL (ref 0.3–1.2)
Total Protein: 7.3 g/dL (ref 6.5–8.1)

## 2016-03-08 LAB — CBC
HEMATOCRIT: 32.1 % — AB (ref 39.0–52.0)
Hemoglobin: 11.1 g/dL — ABNORMAL LOW (ref 13.0–17.0)
MCH: 37.5 pg — AB (ref 26.0–34.0)
MCHC: 34.6 g/dL (ref 30.0–36.0)
MCV: 108.4 fL — AB (ref 78.0–100.0)
Platelets: 113 10*3/uL — ABNORMAL LOW (ref 150–400)
RBC: 2.96 MIL/uL — ABNORMAL LOW (ref 4.22–5.81)
RDW: 13 % (ref 11.5–15.5)
WBC: 3.1 10*3/uL — AB (ref 4.0–10.5)

## 2016-03-08 LAB — LIPASE, BLOOD: LIPASE: 17 U/L (ref 11–51)

## 2016-03-08 NOTE — ED Triage Notes (Addendum)
Pt states that he has been constipated over the last month and this weekend he was moving his bowels and feels like her injured his pelvis. He's states that he feels like his pelvis has shifted and that his balance is off because of it. No falls or other injury noted. A&Ox4. Endorses nasuea

## 2016-03-09 ENCOUNTER — Inpatient Hospital Stay (HOSPITAL_COMMUNITY)
Admission: EM | Admit: 2016-03-09 | Discharge: 2016-03-14 | DRG: 059 | Disposition: A | Discharge status: Home Health | Payer: 59 | Attending: Internal Medicine | Admitting: Internal Medicine

## 2016-03-09 ENCOUNTER — Emergency Department (HOSPITAL_COMMUNITY): Payer: 59

## 2016-03-09 ENCOUNTER — Observation Stay (HOSPITAL_COMMUNITY): Payer: 59

## 2016-03-09 DIAGNOSIS — G35 Multiple sclerosis: Principal | ICD-10-CM

## 2016-03-09 DIAGNOSIS — R0602 Shortness of breath: Secondary | ICD-10-CM

## 2016-03-09 DIAGNOSIS — D6959 Other secondary thrombocytopenia: Secondary | ICD-10-CM | POA: Diagnosis present

## 2016-03-09 DIAGNOSIS — D519 Vitamin B12 deficiency anemia, unspecified: Secondary | ICD-10-CM | POA: Diagnosis present

## 2016-03-09 DIAGNOSIS — K649 Unspecified hemorrhoids: Secondary | ICD-10-CM | POA: Diagnosis present

## 2016-03-09 DIAGNOSIS — K921 Melena: Secondary | ICD-10-CM | POA: Diagnosis present

## 2016-03-09 DIAGNOSIS — R079 Chest pain, unspecified: Secondary | ICD-10-CM

## 2016-03-09 DIAGNOSIS — G379 Demyelinating disease of central nervous system, unspecified: Secondary | ICD-10-CM

## 2016-03-09 DIAGNOSIS — K625 Hemorrhage of anus and rectum: Secondary | ICD-10-CM

## 2016-03-09 DIAGNOSIS — R195 Other fecal abnormalities: Secondary | ICD-10-CM

## 2016-03-09 DIAGNOSIS — G320 Subacute combined degeneration of spinal cord in diseases classified elsewhere: Secondary | ICD-10-CM | POA: Diagnosis present

## 2016-03-09 DIAGNOSIS — Z87891 Personal history of nicotine dependence: Secondary | ICD-10-CM | POA: Diagnosis not present

## 2016-03-09 DIAGNOSIS — K5900 Constipation, unspecified: Secondary | ICD-10-CM

## 2016-03-09 DIAGNOSIS — K219 Gastro-esophageal reflux disease without esophagitis: Secondary | ICD-10-CM | POA: Diagnosis present

## 2016-03-09 DIAGNOSIS — D509 Iron deficiency anemia, unspecified: Secondary | ICD-10-CM | POA: Diagnosis present

## 2016-03-09 DIAGNOSIS — R260 Ataxic gait: Secondary | ICD-10-CM | POA: Diagnosis present

## 2016-03-09 DIAGNOSIS — E538 Deficiency of other specified B group vitamins: Secondary | ICD-10-CM | POA: Diagnosis not present

## 2016-03-09 LAB — HEMOGLOBIN AND HEMATOCRIT, BLOOD
HEMATOCRIT: 35.8 % — AB (ref 39.0–52.0)
Hemoglobin: 12.1 g/dL — ABNORMAL LOW (ref 13.0–17.0)

## 2016-03-09 LAB — POC OCCULT BLOOD, ED: FECAL OCCULT BLD: POSITIVE — AB

## 2016-03-09 LAB — URINALYSIS, ROUTINE W REFLEX MICROSCOPIC
Bilirubin Urine: NEGATIVE
GLUCOSE, UA: NEGATIVE mg/dL
HGB URINE DIPSTICK: NEGATIVE
KETONES UR: 5 mg/dL — AB
LEUKOCYTES UA: NEGATIVE
Nitrite: NEGATIVE
Protein, ur: NEGATIVE mg/dL
Specific Gravity, Urine: 1.01 (ref 1.005–1.030)
pH: 6 (ref 5.0–8.0)

## 2016-03-09 MED ORDER — POLYETHYLENE GLYCOL 3350 17 G PO PACK
17.0000 g | PACK | Freq: Two times a day (BID) | ORAL | Status: AC
  Administered 2016-03-09 – 2016-03-10 (×3): 17 g via ORAL
  Filled 2016-03-09 (×3): qty 1

## 2016-03-09 MED ORDER — SODIUM CHLORIDE 0.9 % IV SOLN
1000.0000 mg | Freq: Every day | INTRAVENOUS | Status: DC
  Filled 2016-03-09: qty 8

## 2016-03-09 MED ORDER — SENNOSIDES-DOCUSATE SODIUM 8.6-50 MG PO TABS
2.0000 | ORAL_TABLET | Freq: Two times a day (BID) | ORAL | Status: DC
  Administered 2016-03-09 – 2016-03-14 (×9): 2 via ORAL
  Filled 2016-03-09 (×10): qty 2

## 2016-03-09 MED ORDER — SODIUM CHLORIDE 0.9 % IV SOLN
1000.0000 mg | Freq: Every day | INTRAVENOUS | Status: DC
  Administered 2016-03-09 – 2016-03-12 (×4): 1000 mg via INTRAVENOUS
  Filled 2016-03-09 (×5): qty 8

## 2016-03-09 MED ORDER — GADOBENATE DIMEGLUMINE 529 MG/ML IV SOLN
20.0000 mL | Freq: Once | INTRAVENOUS | Status: AC | PRN
  Administered 2016-03-09: 17 mL via INTRAVENOUS

## 2016-03-09 MED ORDER — PANTOPRAZOLE SODIUM 40 MG PO TBEC: 40.0000 mg | DELAYED_RELEASE_TABLET | Freq: Two times a day (BID) | ORAL | Status: DC

## 2016-03-09 MED ORDER — PANTOPRAZOLE SODIUM 40 MG PO TBEC
40.0000 mg | DELAYED_RELEASE_TABLET | Freq: Two times a day (BID) | ORAL | Status: DC
  Administered 2016-03-09 – 2016-03-14 (×11): 40 mg via ORAL
  Filled 2016-03-09 (×11): qty 1

## 2016-03-09 NOTE — ED Notes (Signed)
ED Provider at bedside. 

## 2016-03-09 NOTE — ED Notes (Signed)
Spoke with Patient's Wife Nidal Marcus, and made her aware that pt was being transferred to [facility] .

## 2016-03-09 NOTE — ED Notes (Signed)
Pt transported to MRI 

## 2016-03-09 NOTE — H&P (Signed)
[facility] H&P   Patient Demographics:    Daniel Morales, is a 46 y.o. male  MRN: [id number]   DOB - 1970/06/22  Admit Date - 03/09/2016  Outpatient Primary MD for the patient is No PCP Per Patient  Referring MD/NP/PA: Dr Preston Fleeting  Patient coming from: Home  Chief Complaint  Patient presents with  . Pelvic Pain      HPI:    Daniel Morales  is a 46 y.o. male, Without significant past medical history, presents with multiple complaints, including small amount of blood only when he wipes, abdominal pain after bowel movement, he reports constipation for a few days, he denies any melena, bright red blood per rectum, no nausea, no vomiting, no coffee-ground emesis, patient does have hemorrhoids on physical exam, hemoglobin is 11.1, baseline 15-16. As well patient reports his been having unsteady gait for the last week, reports couple falls recently, as well reports some paracentesis and decreased sensation in hand and feet, more I brain with and without contrast was significant for multiple sclerosis is sclerosis with active demyelination, he denies any fever, any chills, any polyuria, any dysuria, any specific focal deficits, any slurred speech, altered mental status or vision problems, I was called to admit.    Review of systems:    In addition to the HPI above,  No Fever-chills, No Headache, No changes with Vision or hearing, No problems swallowing food or Liquids, No Chest pain, Cough or Shortness of Breath, Reports mild abdominal pain, blood when wiping, no nausea no vomiting, reports constipation No Blood in stool or Urine, No dysuria, No new skin rashes or bruises, No new joints pains-aches,  Reports unsteady gait with fall, decreased sensation, tingling and numbness in hands and feet No recent weight gain or loss, No polyuria, polydypsia or polyphagia, No significant  Mental Stressors.  A full 10 point Review of Systems was done, except as stated above, all other Review of Systems were negative.   With Past History of the following :    Past Medical History:  Diagnosis Date  . Constipation   . Environmental allergies    year round  . GERD (gastroesophageal reflux disease)    "heartburn at times"  . Headache(784.0)   . Shortness of breath    "bc of my allergies"      Past Surgical History:  Procedure Laterality Date  . CHOLECYSTECTOMY  04/21/2011   Procedure: LAPAROSCOPIC CHOLECYSTECTOMY WITH INTRAOPERATIVE CHOLANGIOGRAM;  Surgeon: Valarie Merino, MD;  Location: [facility];  Service: General;  Laterality: N/A;  . CIRCUMCISION    . ERCP  04/20/2011   Procedure: ENDOSCOPIC RETROGRADE CHOLANGIOPANCREATOGRAPHY (ERCP);  Surgeon: Petra Kuba, MD;  Location: [facility];  Service: Endoscopy;  Laterality: N/A;      Social History:     Social History  Substance Use Topics  . Smoking status: Former Games developer  . Smokeless tobacco: Never Used  .  Alcohol use No     Lives - at home.   Mobility - independent, he works as a Naval architect    Family History :     Family History  Problem Relation Age of Onset  . Hypertension Other   . Diabetes Other    Eyes any family history of multiple sclerosis  Home Medications:   Prior to Admission medications   Medication Sig Start Date End Date Taking? Authorizing Provider  aspirin-acetaminophen-caffeine (EXCEDRIN MIGRAINE) [phone] MG tablet Take 1 tablet by mouth every 6 (six) hours as needed for headache.   Yes Historical Provider, MD  ibuprofen (ADVIL,MOTRIN) 200 MG tablet Take 200 mg by mouth every 6 (six) hours as needed. For pain relief   Yes Historical Provider, MD     Allergies:    No Known Allergies   Physical Exam:   Vitals  Blood pressure 116/74, pulse 76, temperature 98.5 F (36.9 C), resp. rate 16, height 5' 10.5" (1.791 m), weight 81.6 kg (180 lb), SpO2 100 %.   1. General   Well-developed male lying in bed in NAD,    2. Normal affect and insight, Not Suicidal or Homicidal, Awake Alert, Oriented X 3.  3. No F.N deficits, ALL C.Nerves Intact, Strength 5/5 all 4 extremities, Plantars down going.  4. Ears and Eyes appear Normal, Conjunctivae clear, PERRLA. Moist Oral Mucosa.  5. Supple Neck, No JVD, No cervical lymphadenopathy appriciated, No Carotid Bruits.  6. Symmetrical Chest wall movement, Good air movement bilaterally, CTAB.  7. RRR, No Gallops, Rubs or Murmurs, No Parasternal Heave.  8. Positive Bowel Sounds, Abdomen Soft, No tenderness,No rebound -guarding or rigidity, rectal exam significant for  Hemorrhoid at 3:00, nonbleeding and not thrombosed.  9.  No Cyanosis, Normal Skin Turgor, No Skin Rash or Bruise.  10. Good muscle tone,  joints appear normal , no effusions, Normal ROM.  11. No Palpable Lymph Nodes in Neck or Axillae     Data Review:    CBC  Recent Labs Lab 03/08/16 1836  WBC 3.1*  HGB 11.1*  HCT 32.1*  PLT 113*  MCV 108.4*  MCH 37.5*  MCHC 34.6  RDW 13.0   ------------------------------------------------------------------------------------------------------------------  Chemistries   Recent Labs Lab 03/08/16 1836  NA 142  K 3.7  CL 108  CO2 25  GLUCOSE 88  BUN 13  CREATININE 0.97  CALCIUM 9.1  AST 22  ALT 19  ALKPHOS 82  BILITOT 0.9   ------------------------------------------------------------------------------------------------------------------ estimated creatinine clearance is 99.9 mL/min (by C-G formula based on SCr of 0.97 mg/dL). ------------------------------------------------------------------------------------------------------------------ No results for input(s): TSH, T4TOTAL, T3FREE, THYROIDAB in the last 72 hours.  Invalid input(s): FREET3  Coagulation profile No results for input(s): INR, PROTIME in the last 168  hours. ------------------------------------------------------------------------------------------------------------------- No results for input(s): DDIMER in the last 72 hours. -------------------------------------------------------------------------------------------------------------------  Cardiac Enzymes No results for input(s): CKMB, TROPONINI, MYOGLOBIN in the last 168 hours.  Invalid input(s): CK ------------------------------------------------------------------------------------------------------------------ No results found for: BNP   ---------------------------------------------------------------------------------------------------------------  Urinalysis    Component Value Date/Time   COLORURINE YELLOW 03/08/2016 0228   APPEARANCEUR CLEAR 03/08/2016 0228   LABSPEC 1.010 03/08/2016 0228   PHURINE 6.0 03/08/2016 0228   GLUCOSEU NEGATIVE 03/08/2016 0228   HGBUR NEGATIVE 03/08/2016 0228   BILIRUBINUR NEGATIVE 03/08/2016 0228   KETONESUR 5 (A) 03/08/2016 0228   PROTEINUR NEGATIVE 03/08/2016 0228   UROBILINOGEN 4.0 (H) 04/19/2011 2207   NITRITE NEGATIVE 03/08/2016 0228   LEUKOCYTESUR NEGATIVE 03/08/2016 0228    ----------------------------------------------------------------------------------------------------------------   Imaging  Results:    Dg Abdomen 1 View  Result Date: 03/09/2016 CLINICAL DATA:  46 y/o  M; constipation. EXAM: ABDOMEN - 1 VIEW COMPARISON:  02/07/2015 abdomen radiograph. FINDINGS: The bowel gas pattern is normal. No radio-opaque calculi or other significant radiographic abnormality are seen. Right upper quadrant surgical clips, presumably cholecystectomy. Right pelvic phleboliths. IMPRESSION: Negative. Electronically Signed   By: Mitzi Hansen M.D.   On: 03/09/2016 01:27   Ct Head Wo Contrast  Result Date: 03/09/2016 CLINICAL DATA:  Ataxia with increasing gait disturbance for 1 week. Decreased sensation in the hands and feet. EXAM: CT HEAD  WITHOUT CONTRAST TECHNIQUE: Contiguous axial images were obtained from the base of the skull through the vertex without intravenous contrast. COMPARISON:  None. FINDINGS: Brain: There vague patchy low-attenuation changes suggested in the deep white matter. This appearance is nonspecific in could represent small vessel ischemic change or other demyelinating/ dysmyelinating process. Consider MS in the appropriate clinical setting. No evidence of acute infarction, hemorrhage, hydrocephalus, extra-axial collection or mass lesion/mass effect. Vascular: No hyperdense vessel or unexpected calcification. Skull: Normal. Negative for fracture or focal lesion. Sinuses/Orbits: Mild mucosal thickening in the paranasal sinuses. No acute air-fluid levels. Mastoid air cells are not opacified. Other: None. IMPRESSION: Suggestion of nonspecific white matter changes. Consider MRI if clinically indicated. No acute intracranial hemorrhage or mass effect. Electronically Signed   By: Burman Nieves M.D.   On: 03/09/2016 01:44   Mr Laqueta Jean And Wo Contrast  Result Date: 03/09/2016 CLINICAL DATA:  Gait ataxia.  Symptoms for 1 week, worsening. EXAM: MRI HEAD WITHOUT AND WITH CONTRAST TECHNIQUE: Multiplanar, multiecho pulse sequences of the brain and surrounding structures were obtained without and with intravenous contrast. CONTRAST:  17mL MULTIHANCE GADOBENATE DIMEGLUMINE 529 MG/ML IV SOLN COMPARISON:  None. FINDINGS: Brain: Extensive cerebral white matter disease with the periventricular predominance. Although in places confluent, there is still distinguishable ovoid radiating periventricular lesions. DWI hyperintensity around the occipital horn of the left lateral ventricle is likely shine through. There is hazy restricted diffusion about the atrium of the right lateral ventricle. No infratentorial signal abnormalities noted. There is at least 4 juxta cortical signal abnormalities at the vertex. On the lowest slices, there is visible  paired edematous signal in the dorsal columns of the upper cervical cord. No abnormal intracranial enhancement. No acute infarction, hemorrhage, hydrocephalus, extra-axial collection or mass lesion. Vascular: Preserved flow voids Skull and upper cervical spine: Negative Sinuses/Orbits: Patchy mucosal thickening with fluid levels in the right sphenoid sinus lateral recesses. Opacification of left petrous apex air cells without obstructive or expansive changes. Mucous retention cysts in the left maxillary sinus. IMPRESSION: 1. Extensive white matter disease with demyelinating pattern, especially multiple sclerosis. Right periventricular diffusion signal implies active demyelination. 2. Bilateral dorsal column signal abnormality, partly visualized. Recommend cord imaging. Electronically Signed   By: Marnee Spring M.D.   On: 03/09/2016 07:07     Assessment & Plan:    Active Problems:   Ataxic gait   Multiple sclerosis (HCC)   Constipation  Ataxic gait MRI evidence of multiple sclerosis with active demyelination. - Impression presents with ataxia and unsteady gait over last 1-1/2 week, MRI brain with evidence of findings suggestive of multiple sclerosis with active demyelination, discussed with neuro, patient will be transferred to [facility] with her evaluation,  - D/Wneurology Dr Grace Isaac , giving evidence of acute onset of symptoms and active demyelination, will start him on IV Solu-Medrol 1 g daily, as well as have him  on Protonix 40 twice a day, will need to obtain MRI cervical/thoracic spine with and without contrast.(Discussed with MRI staff, has to wait 48 hours before obtaining cervical/thoracic spine with contrast given he received contrast today for MRI brain with contrast)  Blood in stool - Patient reports some blood on the napkins when he wipes, otherwise denies any bright red blood per rectum or melena, he does have hemorrhoids, has been recently constipated, will start him on  laxative, monitor H&H closely.  DVT Prophylaxis Heparin - SCDs  AM Labs Ordered, also please review Full Orders  Family Communication: Admission, patients condition and plan of care including tests being ordered have been discussed with the patient who indicate understanding and agree with the plan and Code Status.  Code Status Full  Likely DC to  Home  Condition GUARDED    Consults called: Neuro  Admission status: observation  Time spent in minutes : 55 minutes   Mabel Unrein M.D on 03/09/2016 at 8:36 AM  Between 7am to 7pm - Pager - [phone]. After 7pm go to [email or web address] - password [facility]  [facility] - Office  [phone]

## 2016-03-09 NOTE — ED Notes (Signed)
Pt resting comfortably, watching TV, denies pain. Lunch tray given.

## 2016-03-09 NOTE — ED Provider Notes (Signed)
[facility]-EMERGENCY DEPT Provider Note   CSN: [id number] Arrival date & time: 03/08/16  1801  By signing my name below, I, Elder Negus, attest that this documentation has been prepared under the direction and in the presence of Dione Booze, MD. Electronically Signed: Elder Negus, Scribe. 03/09/16. 1:09 AM.   History   Chief Complaint Chief Complaint  Patient presents with  . Pelvic Pain    HPI Daniel Morales is a 46 y.o. male who presents to the ED for evaluation of abdominal pain. This patient states that for the last 3 days he is experiencing suprapubic "pressure" along with difficulty stooling. His pain heightens while straining. He is also reporting rectal bleeding which he notices only while wiping. He suspects hemorrhoids. He is also reporting gait problems for 1 week that have worsened; "I have trouble walking in a straight lines. At times I walk into walls." He usually falls to his left. He denies any difficulty gripping objects or other issues with coordination. He does report decreased sensation in his "hands, feet, and the front of his thighs."   The history is provided by the patient. No language interpreter was used.    Past Medical History:  Diagnosis Date  . Constipation   . Environmental allergies    year round  . GERD (gastroesophageal reflux disease)    "heartburn at times"  . Headache(784.0)   . Shortness of breath    "bc of my allergies"    Patient Active Problem List   Diagnosis Date Noted  . S/P laparoscopic cholecystectomy & ERCP March 2013 05/06/2011  . Symptomatic cholelithiasis 04/20/2011  . Choledocholithiasis 04/20/2011    Past Surgical History:  Procedure Laterality Date  . CHOLECYSTECTOMY  04/21/2011   Procedure: LAPAROSCOPIC CHOLECYSTECTOMY WITH INTRAOPERATIVE CHOLANGIOGRAM;  Surgeon: Valarie Merino, MD;  Location: [facility];  Service: General;  Laterality: N/A;  . CIRCUMCISION    . ERCP  04/20/2011   Procedure: ENDOSCOPIC  RETROGRADE CHOLANGIOPANCREATOGRAPHY (ERCP);  Surgeon: Petra Kuba, MD;  Location: [facility];  Service: Endoscopy;  Laterality: N/A;       Home Medications    Prior to Admission medications   Medication Sig Start Date End Date Taking? Authorizing Provider  aspirin-acetaminophen-caffeine (EXCEDRIN MIGRAINE) [phone] MG tablet Take 1 tablet by mouth every 6 (six) hours as needed for headache.    Historical Provider, MD  docusate sodium (COLACE) 100 MG capsule Take 1 capsule (100 mg total) by mouth every 12 (twelve) hours. 02/07/15   Pricilla Loveless, MD  ibuprofen (ADVIL,MOTRIN) 200 MG tablet Take 200 mg by mouth every 6 (six) hours as needed. For pain relief    Historical Provider, MD  Lidocaine, Anorectal, 5 % CREA Apply to affected area (rectum) up to 6 times daily 02/07/15   Pricilla Loveless, MD  magnesium hydroxide (MILK OF MAGNESIA) 400 MG/5ML suspension Take 15 mLs by mouth daily as needed for moderate constipation. 02/07/15   Pricilla Loveless, MD  oxymetazoline (AFRIN) 0.05 % nasal spray Place 1 spray into both nostrils 2 (two) times daily as needed for congestion.    Historical Provider, MD  polyethylene glycol (MIRALAX / GLYCOLAX) packet Take 17 g by mouth 2 (two) times daily as needed for moderate constipation or severe constipation. 02/07/15   Pricilla Loveless, MD    Family History Family History  Problem Relation Age of Onset  . Hypertension Other   . Diabetes Other     Social History Social History  Substance Use Topics  . Smoking status: Former Games developer  .  Smokeless tobacco: Never Used  . Alcohol use No     Allergies   Patient has no known allergies.   Review of Systems Review of Systems  Gastrointestinal: Positive for abdominal pain and constipation.       Blood while wiping.   Neurological: Positive for numbness.       Gait problem. Decreased sensation.   All other systems reviewed and are negative.    Physical Exam Updated Vital Signs BP 156/94 (BP Location:  Right Arm)   Pulse 77   Temp 98.5 F (36.9 C) (Oral)   Resp 17   Ht 5' 10.5" (1.791 m)   Wt 180 lb (81.6 kg)   SpO2 100%   BMI 25.46 kg/m   Physical Exam  Constitutional: He is oriented to person, place, and time. He appears well-developed and well-nourished.  HENT:  Head: Normocephalic and atraumatic.  Eyes: EOM are normal. Pupils are equal, round, and reactive to light.  Neck: Normal range of motion. Neck supple. No JVD present.  Cardiovascular: Normal rate, regular rhythm and normal heart sounds.   No murmur heard. Pulmonary/Chest: Effort normal and breath sounds normal. He has no wheezes. He has no rales. He exhibits no tenderness.  Abdominal: Soft. Bowel sounds are normal. He exhibits no distension and no mass.  Mild suprapubic tenderness.   Genitourinary:  Genitourinary Comments: Rectal: Normal sphincter tone. Internal hemorrhoids present. Moderately enlarged prostate. Small amount of brown stool present.   Musculoskeletal: Normal range of motion. He exhibits no edema.  Lymphadenopathy:    He has no cervical adenopathy.  Neurological: He is alert and oriented to person, place, and time. No cranial nerve deficit. He exhibits normal muscle tone. Coordination normal.  Normal finger to nose. Positive Romberg; falls to the left.   Skin: Skin is warm and dry. No rash noted.  Psychiatric: He has a normal mood and affect. His behavior is normal. Judgment and thought content normal.  Nursing note and vitals reviewed.    ED Treatments / Results  DIAGNOSTIC STUDIES: Oxygen Saturation is 100 percent on room air which is normal by my interpretation.    COORDINATION OF CARE: 1:00 AM Discussed treatment plan with pt at bedside and pt agreed to plan.  Labs (all labs ordered are listed, but only abnormal results are displayed) Labs Reviewed  CBC - Abnormal; Notable for the following:       Result Value   WBC 3.1 (*)    RBC 2.96 (*)    Hemoglobin 11.1 (*)    HCT 32.1 (*)    MCV  108.4 (*)    MCH 37.5 (*)    Platelets 113 (*)    All other components within normal limits  URINALYSIS, ROUTINE W REFLEX MICROSCOPIC - Abnormal; Notable for the following:    Ketones, ur 5 (*)    All other components within normal limits  POC OCCULT BLOOD, ED - Abnormal; Notable for the following:    Fecal Occult Bld POSITIVE (*)    All other components within normal limits  LIPASE, BLOOD  COMPREHENSIVE METABOLIC PANEL    Radiology Dg Abdomen 1 View  Result Date: 03/09/2016 CLINICAL DATA:  46 y/o  M; constipation. EXAM: ABDOMEN - 1 VIEW COMPARISON:  02/07/2015 abdomen radiograph. FINDINGS: The bowel gas pattern is normal. No radio-opaque calculi or other significant radiographic abnormality are seen. Right upper quadrant surgical clips, presumably cholecystectomy. Right pelvic phleboliths. IMPRESSION: Negative. Electronically Signed   By: Mitzi Hansen M.D.   On: 03/09/2016  01:27   Ct Head Wo Contrast  Result Date: 03/09/2016 CLINICAL DATA:  Ataxia with increasing gait disturbance for 1 week. Decreased sensation in the hands and feet. EXAM: CT HEAD WITHOUT CONTRAST TECHNIQUE: Contiguous axial images were obtained from the base of the skull through the vertex without intravenous contrast. COMPARISON:  None. FINDINGS: Brain: There vague patchy low-attenuation changes suggested in the deep white matter. This appearance is nonspecific in could represent small vessel ischemic change or other demyelinating/ dysmyelinating process. Consider MS in the appropriate clinical setting. No evidence of acute infarction, hemorrhage, hydrocephalus, extra-axial collection or mass lesion/mass effect. Vascular: No hyperdense vessel or unexpected calcification. Skull: Normal. Negative for fracture or focal lesion. Sinuses/Orbits: Mild mucosal thickening in the paranasal sinuses. No acute air-fluid levels. Mastoid air cells are not opacified. Other: None. IMPRESSION: Suggestion of nonspecific white matter  changes. Consider MRI if clinically indicated. No acute intracranial hemorrhage or mass effect. Electronically Signed   By: Burman Nieves M.D.   On: 03/09/2016 01:44   Mr Laqueta Jean And Wo Contrast  Result Date: 03/09/2016 CLINICAL DATA:  Gait ataxia.  Symptoms for 1 week, worsening. EXAM: MRI HEAD WITHOUT AND WITH CONTRAST TECHNIQUE: Multiplanar, multiecho pulse sequences of the brain and surrounding structures were obtained without and with intravenous contrast. CONTRAST:  17mL MULTIHANCE GADOBENATE DIMEGLUMINE 529 MG/ML IV SOLN COMPARISON:  None. FINDINGS: Brain: Extensive cerebral white matter disease with the periventricular predominance. Although in places confluent, there is still distinguishable ovoid radiating periventricular lesions. DWI hyperintensity around the occipital horn of the left lateral ventricle is likely shine through. There is hazy restricted diffusion about the atrium of the right lateral ventricle. No infratentorial signal abnormalities noted. There is at least 4 juxta cortical signal abnormalities at the vertex. On the lowest slices, there is visible paired edematous signal in the dorsal columns of the upper cervical cord. No abnormal intracranial enhancement. No acute infarction, hemorrhage, hydrocephalus, extra-axial collection or mass lesion. Vascular: Preserved flow voids Skull and upper cervical spine: Negative Sinuses/Orbits: Patchy mucosal thickening with fluid levels in the right sphenoid sinus lateral recesses. Opacification of left petrous apex air cells without obstructive or expansive changes. Mucous retention cysts in the left maxillary sinus. IMPRESSION: 1. Extensive white matter disease with demyelinating pattern, especially multiple sclerosis. Right periventricular diffusion signal implies active demyelination. 2. Bilateral dorsal column signal abnormality, partly visualized. Recommend cord imaging. Electronically Signed   By: Marnee Spring M.D.   On: 03/09/2016 07:07      Procedures Procedures (including critical care time)  Medications Ordered in ED Medications  gadobenate dimeglumine (MULTIHANCE) injection 20 mL (17 mLs Intravenous Contrast Given 03/09/16 9562)     Initial Impression / Assessment and Plan / ED Course  I have reviewed the triage vital signs and the nursing notes.  Pertinent labs & imaging results that were available during my care of the patient were reviewed by me and considered in my medical decision making (see chart for details).  Rectal bleeding with no blood seen on exam but Hemoccult positive stool. Presence of hemorrhoids as possible source of bleeding. Ataxic gait with consistent fall to the left. Consider stroke, consider MS. He is sent for CT of the head which shows some white matter changes and MRI is ordered. Case is discussed with Dr. Julian Reil [facility] who agrees to admit the patient under observation status. Review of old records shows a prior ED visit with constipation.  MRI has come back suggestive of demyelinating disease such as  MS. He will need neurologic consultation and anticipate need for high-dose steroids.  Final Clinical Impressions(s) / ED Diagnoses   Final diagnoses:  Ataxic gait  Rectal bleeding    New Prescriptions New Prescriptions   No medications on file   I personally performed the services described in this documentation, which was scribed in my presence. The recorded information has been reviewed and is accurate.      Dione Booze, MD 03/09/16 [phone]

## 2016-03-09 NOTE — ED Notes (Signed)
Pt assisted to bathroom. He is ambulating with unsteady gait, reports feeling weak.

## 2016-03-09 NOTE — ED Notes (Signed)
Pt returned from MRI °

## 2016-03-09 NOTE — ED Notes (Signed)
Spoke with

## 2016-03-09 NOTE — ED Notes (Signed)
Patient transported to X-ray 

## 2016-03-09 NOTE — Consult Note (Signed)
NEURO HOSPITALIST CONSULT NOTE   Requestig physician: Dr. Randol Kern   Reason for Consult: Newly diagnosed with MS   History obtained from:  Patient    HPI:                                                                                                                                          Daniel Morales is an 46 y.o. male Without significant past medical history, presents with multiple complaints, including small amount of blood only when he wipes, abdominal pain after bowel movement, he reports constipation for a few days, he denies any melena, bright red blood per rectum, no nausea, no vomiting, no coffee-ground emesis, patient does have hemorrhoids on physical exam, hemoglobin is 11.1, baseline 15-16.  patient reports his been having unsteady gait for the last week, reports couple falls recently. MRI was obtained and does show signs of demyelination.   In talking with patient he states that over the weekend he is noticed that bilateral legs and felt decreased sensation. He seems to notice this has been occurring over the last couple [employer] and thought nothing of it. The reason he was brought to the hospital this time was because he felt as though he has lost balance and was having difficulty with gait. With more discussion is also noticed some intermittent symptoms such as tingling in his arms and at times finding difficulty getting his words out. He does not know of any family members that have any autoimmune processes or also sclerosis. Then again he also states that he has never asked most questions specifically. He denies ever having a sensation of an MS hug.  Past Medical History:  Diagnosis Date  . Constipation   . Environmental allergies    year round  . GERD (gastroesophageal reflux disease)    "heartburn at times"  . Headache(784.0)   . Shortness of breath    "bc of my allergies"    Past Surgical History:  Procedure Laterality Date  .  CHOLECYSTECTOMY  04/21/2011   Procedure: LAPAROSCOPIC CHOLECYSTECTOMY WITH INTRAOPERATIVE CHOLANGIOGRAM;  Surgeon: Valarie Merino, MD;  Location: [facility];  Service: General;  Laterality: N/A;  . CIRCUMCISION    . ERCP  04/20/2011   Procedure: ENDOSCOPIC RETROGRADE CHOLANGIOPANCREATOGRAPHY (ERCP);  Surgeon: Petra Kuba, MD;  Location: [facility];  Service: Endoscopy;  Laterality: N/A;    Family History  Problem Relation Age of Onset  . Hypertension Other   . Diabetes Other      Social History:  reports that he has quit smoking. He has never used smokeless tobacco. He reports that he does not drink alcohol or use drugs.  No Known Allergies  MEDICATIONS:  Current Facility-Administered Medications  Medication Dose Route Frequency Provider Last Rate Last Dose  . methylPREDNISolone sodium succinate (SOLU-MEDROL) 1,000 mg in sodium chloride 0.9 % 50 mL IVPB  1,000 mg Intravenous Daily Dawood S Elgergawy, MD      . pantoprazole (PROTONIX) EC tablet 40 mg  40 mg Oral BID Dawood S Elgergawy, MD      . polyethylene glycol (MIRALAX / GLYCOLAX) packet 17 g  17 g Oral BID Starleen Arms, MD      . senna-docusate (Senokot-S) tablet 2 tablet  2 tablet Oral BID Starleen Arms, MD       Current Outpatient Prescriptions  Medication Sig Dispense Refill  . aspirin-acetaminophen-caffeine (EXCEDRIN MIGRAINE) 250-250-65 MG tablet Take 1 tablet by mouth every 6 (six) hours as needed for headache.    . ibuprofen (ADVIL,MOTRIN) 200 MG tablet Take 200 mg by mouth every 6 (six) hours as needed. For pain relief       ROS:                                                                                                                                       History obtained from the patient  General ROS: negative for - chills, fatigue, fever, night sweats, weight gain or weight  loss Psychological ROS: negative for - behavioral disorder, hallucinations, memory difficulties, mood swings or suicidal ideation Ophthalmic ROS: negative for - blurry vision, double vision, eye pain or loss of vision ENT ROS: negative for - epistaxis, nasal discharge, oral lesions, sore throat, tinnitus or vertigo Allergy and Immunology ROS: negative for - hives or itchy/watery eyes Hematological and Lymphatic ROS: negative for - bleeding problems, bruising or swollen lymph nodes Endocrine ROS: negative for - galactorrhea, hair pattern changes, polydipsia/polyuria or temperature intolerance Respiratory ROS: negative for - cough, hemoptysis, shortness of breath or wheezing Cardiovascular ROS: negative for - chest pain, dyspnea on exertion, edema or irregular heartbeat Gastrointestinal ROS: negative for - abdominal pain, diarrhea, hematemesis, nausea/vomiting or stool incontinence Genito-Urinary ROS: negative for - dysuria, hematuria, incontinence or urinary frequency/urgency Musculoskeletal ROS: negative for - joint swelling or muscular weakness Neurological ROS: as noted in HPI Dermatological ROS: negative for rash and skin lesion changes   Blood pressure 116/74, pulse 76, temperature 98.5 F (36.9 C), resp. rate 16, height 5' 10.5" (1.791 m), weight 81.6 kg (180 lb), SpO2 100 %.   Neurologic Examination:  HEENT-  Normocephalic, no lesions, without obvious abnormality.  Normal external eye and conjunctiva.  Normal TM's bilaterally.  Normal auditory canals and external ears. Normal external nose, mucus membranes and septum.  Normal pharynx. Cardiovascular- S1, S2 normal, pulses palpable throughout   Lungs- chest clear, no wheezing, rales, normal symmetric air entry Abdomen- normal findings: bowel sounds normal Extremities- no edema Lymph-no adenopathy palpable Musculoskeletal-no joint  tenderness, deformity or swelling Skin-warm and dry, no hyperpigmentation, vitiligo, or suspicious lesions  Neurological Examination Mental Status: Alert, oriented, thought content appropriate.  Speech fluent without evidence of aphasia.  Able to follow 3 step commands without difficulty. Cranial Nerves: II: Visual fields grossly normal, pupils equal, round, reactive to light and accommodation III,IV, VI: ptosis not present, extra-ocular motions intact bilaterally V,VII: smile symmetric, facial light touch sensation normal bilaterally VIII: hearing normal bilaterally IX,X: uvula rises symmetrically XI: bilateral shoulder shrug XII: midline tongue extension Motor: Right : Upper extremity   5/5    Left:     Upper extremity   5/5  Lower extremity   5/5     Lower extremity   5/5 Tone and bulk:normal tone throughout; no atrophy noted Sensory: Pinprick and light touch intact throughout, bilaterally Deep Tendon Reflexes: 2+ and symmetric throughout Plantars: Right: downgoing   Left: downgoing Cerebellar: normal finger-to-nose, and normal heel-to-shin test Gait: Not tested due to safety      Lab Results: Basic Metabolic Panel:  Recent Labs Lab 03/08/16 1836  NA 142  K 3.7  CL 108  CO2 25  GLUCOSE 88  BUN 13  CREATININE 0.97  CALCIUM 9.1    Liver Function Tests:  Recent Labs Lab 03/08/16 1836  AST 22  ALT 19  ALKPHOS 82  BILITOT 0.9  PROT 7.3  ALBUMIN 4.6    Recent Labs Lab 03/08/16 1836  LIPASE 17   No results for input(s): AMMONIA in the last 168 hours.  CBC:  Recent Labs Lab 03/08/16 1836  WBC 3.1*  HGB 11.1*  HCT 32.1*  MCV 108.4*  PLT 113*    Cardiac Enzymes: No results for input(s): CKTOTAL, CKMB, CKMBINDEX, TROPONINI in the last 168 hours.  Lipid Panel: No results for input(s): CHOL, TRIG, HDL, CHOLHDL, VLDL, LDLCALC in the last 168 hours.  CBG: No results for input(s): GLUCAP in the last 168 hours.  Microbiology: No results found  for this or any previous visit.  Coagulation Studies: No results for input(s): LABPROT, INR in the last 72 hours.  Imaging: Dg Abdomen 1 View  Result Date: 03/09/2016 CLINICAL DATA:  46 y/o  M; constipation. EXAM: ABDOMEN - 1 VIEW COMPARISON:  02/07/2015 abdomen radiograph. FINDINGS: The bowel gas pattern is normal. No radio-opaque calculi or other significant radiographic abnormality are seen. Right upper quadrant surgical clips, presumably cholecystectomy. Right pelvic phleboliths. IMPRESSION: Negative. Electronically Signed   By: Mitzi Hansen M.D.   On: 03/09/2016 01:27   Ct Head Wo Contrast  Result Date: 03/09/2016 CLINICAL DATA:  Ataxia with increasing gait disturbance for 1 week. Decreased sensation in the hands and feet. EXAM: CT HEAD WITHOUT CONTRAST TECHNIQUE: Contiguous axial images were obtained from the base of the skull through the vertex without intravenous contrast. COMPARISON:  None. FINDINGS: Brain: There vague patchy low-attenuation changes suggested in the deep white matter. This appearance is nonspecific in could represent small vessel ischemic change or other demyelinating/ dysmyelinating process. Consider MS in the appropriate clinical setting. No evidence of acute infarction, hemorrhage, hydrocephalus, extra-axial collection or mass lesion/mass effect.  Vascular: No hyperdense vessel or unexpected calcification. Skull: Normal. Negative for fracture or focal lesion. Sinuses/Orbits: Mild mucosal thickening in the paranasal sinuses. No acute air-fluid levels. Mastoid air cells are not opacified. Other: None. IMPRESSION: Suggestion of nonspecific white matter changes. Consider MRI if clinically indicated. No acute intracranial hemorrhage or mass effect. Electronically Signed   By: Burman Nieves M.D.   On: 03/09/2016 01:44   Mr Laqueta Jean And Wo Contrast  Result Date: 03/09/2016 CLINICAL DATA:  Gait ataxia.  Symptoms for 1 week, worsening. EXAM: MRI HEAD WITHOUT AND WITH  CONTRAST TECHNIQUE: Multiplanar, multiecho pulse sequences of the brain and surrounding structures were obtained without and with intravenous contrast. CONTRAST:  17mL MULTIHANCE GADOBENATE DIMEGLUMINE 529 MG/ML IV SOLN COMPARISON:  None. FINDINGS: Brain: Extensive cerebral white matter disease with the periventricular predominance. Although in places confluent, there is still distinguishable ovoid radiating periventricular lesions. DWI hyperintensity around the occipital horn of the left lateral ventricle is likely shine through. There is hazy restricted diffusion about the atrium of the right lateral ventricle. No infratentorial signal abnormalities noted. There is at least 4 juxta cortical signal abnormalities at the vertex. On the lowest slices, there is visible paired edematous signal in the dorsal columns of the upper cervical cord. No abnormal intracranial enhancement. No acute infarction, hemorrhage, hydrocephalus, extra-axial collection or mass lesion. Vascular: Preserved flow voids Skull and upper cervical spine: Negative Sinuses/Orbits: Patchy mucosal thickening with fluid levels in the right sphenoid sinus lateral recesses. Opacification of left petrous apex air cells without obstructive or expansive changes. Mucous retention cysts in the left maxillary sinus. IMPRESSION: 1. Extensive white matter disease with demyelinating pattern, especially multiple sclerosis. Right periventricular diffusion signal implies active demyelination. 2. Bilateral dorsal column signal abnormality, partly visualized. Recommend cord imaging. Electronically Signed   By: Marnee Spring M.D.   On: 03/09/2016 07:07       Assessment and plan per attending neurologist  Felicie Morn PA-C [facility] [phone]  03/09/2016, 9:10 AM   Assessment/Plan:  This is a 46 year old male with gait instability which apparently occurs annually during the winter times however this year it is more prominent. MRI brain  was obtained and showed what appears to be extensive white matter lesions with demyelinating pattern especially for multiple sclerosis. There is also a white periventricular diffusion signal which implies active demyelination. Patient has received his first dose of 1000 mg of Solu-Medrol. He will receive a total of 5 doses and the plan is to transfer him to [facility] when there is a bed available. Patient will also receive physical therapy. After this visit patient will need to have a outpatient appointment made with Dr. Sherol Dade of [facility] neurology who is an MS specialist to further discuss auto immune modulating medications for further care of his MS.  My attending will addend the note.

## 2016-03-09 NOTE — ED Notes (Signed)
Report given to [id number] RN, [facility] called.

## 2016-03-09 NOTE — ED Notes (Signed)
[facility] given report.  

## 2016-03-10 ENCOUNTER — Inpatient Hospital Stay (HOSPITAL_COMMUNITY): Payer: 59

## 2016-03-10 DIAGNOSIS — K5900 Constipation, unspecified: Secondary | ICD-10-CM

## 2016-03-10 DIAGNOSIS — R260 Ataxic gait: Secondary | ICD-10-CM

## 2016-03-10 MED ORDER — GADOBENATE DIMEGLUMINE 529 MG/ML IV SOLN
15.0000 mL | Freq: Once | INTRAVENOUS | Status: AC
  Administered 2016-03-10: 15 mL via INTRAVENOUS

## 2016-03-10 MED ORDER — BISACODYL 10 MG RE SUPP: 10.0000 mg | Freq: Every day | RECTAL | Status: DC | PRN

## 2016-03-10 MED ORDER — HYDROCORTISONE ACETATE 25 MG RE SUPP
25.0000 mg | Freq: Two times a day (BID) | RECTAL | Status: DC
  Administered 2016-03-12: 25 mg via RECTAL
  Filled 2016-03-10 (×9): qty 1

## 2016-03-10 NOTE — Progress Notes (Signed)
PROGRESS NOTE    Daniel Morales  ZOX:[id number] DOB: 06/06/1970 DOA: 03/09/2016 PCP: No PCP Per Patient   Outpatient Specialists:     Brief Narrative:  Daniel Morales  is a 46 y.o. male, Without significant past medical history, presents with multiple complaints, including small amount of blood only when he wipes, abdominal pain after bowel movement, he reports constipation for a few days, he denies any melena, bright red blood per rectum, no nausea, no vomiting, no coffee-ground emesis, patient does have hemorrhoids on physical exam, hemoglobin is 11.1, baseline 15-16. As well patient reports his been having unsteady gait for the last week, reports couple falls recently, as well reports some paracentesis and decreased sensation in hand and feet, more I brain with and without contrast was significant for multiple sclerosis is sclerosis with active demyelination, he denies any fever, any chills, any polyuria, any dysuria, any specific focal deficits, any slurred speech, altered mental status or vision problems, I was called to admit.   Assessment & Plan:   Active Problems:   Ataxic gait   Multiple sclerosis (HCC)   Constipation   Ataxic gait MRI evidence of multiple sclerosis with active demyelination. - Impression presents with ataxia and unsteady gait over last 1-1/2 week, MRI brain with evidence of findings suggestive of multiple sclerosis with active demyelination -neuro eval - IV Solu-Medrol 1 g daily -Protonix 40 twice a day -MRI cervical/thoracic spine with and without contrast: Abnormal signal within the posterior column of the spinal cord in the region from T7-T10. Findings could be due to demyelinating disease or subacute combined degeneration. Abnormal signal in the dorsal cord from the foramen magnum to C7.   Blood in stool - Patient reports some blood on the napkins when he wipes, otherwise denies any bright red blood per rectum or melena, he does have  hemorrhoids, has been recently constipated, will start him on laxative, monitor H&H closely. -anusol   DVT prophylaxis:  SCD's  Code Status: Full Code   Family Communication:   Disposition Plan:     Consultants:   neuro   Subjective: Having some rectal pain  Objective: Vitals:   03/09/16 2200 03/10/16 0206 03/10/16 0641 03/10/16 1120  BP: 137/90 121/78 116/79 117/76  Pulse: 100 89 76 73  Resp: 18 16 16 16   Temp: (!) 100.4 F (38 C) 98 F (36.7 C) 98.5 F (36.9 C) 99.3 F (37.4 C)  TempSrc: Oral Oral Oral Oral  SpO2: 100% 100% 100% 100%  Weight: 78.7 kg (173 lb 6.4 oz)     Height: 5\' 10"  (1.778 m)       Intake/Output Summary (Last 24 hours) at 03/10/16 1205 Last data filed at 03/10/16 1130  Gross per 24 hour  Intake             1170 ml  Output                0 ml  Net             1170 ml   Filed Weights   03/08/16 1826 03/09/16 2200  Weight: 81.6 kg (180 lb) 78.7 kg (173 lb 6.4 oz)    Examination:  General exam: Appears calm and comfortable  Respiratory system: Clear to auscultation. Respiratory effort normal. Cardiovascular system: S1 & S2 heard, RRR. No JVD, murmurs, rubs, gallops or clicks. No pedal edema. Gastrointestinal system: Abdomen is nondistended, soft and nontender. No organomegaly or masses felt. Normal bowel sounds heard. Central nervous system: Alert and oriented.  No focal neurological deficits.     Data Reviewed: I have personally reviewed following labs and imaging studies  CBC:  Recent Labs Lab 03/08/16 1836 03/09/16 1815  WBC 3.1*  --   HGB 11.1* 12.1*  HCT 32.1* 35.8*  MCV 108.4*  --   PLT 113*  --    Basic Metabolic Panel:  Recent Labs Lab 03/08/16 1836  NA 142  K 3.7  CL 108  CO2 25  GLUCOSE 88  BUN 13  CREATININE 0.97  CALCIUM 9.1   GFR: Estimated Creatinine Clearance: 98.3 mL/min (by C-G formula based on SCr of 0.97 mg/dL). Liver Function Tests:  Recent Labs Lab 03/08/16 1836  AST 22  ALT 19    ALKPHOS 82  BILITOT 0.9  PROT 7.3  ALBUMIN 4.6    Recent Labs Lab 03/08/16 1836  LIPASE 17   No results for input(s): AMMONIA in the last 168 hours. Coagulation Profile: No results for input(s): INR, PROTIME in the last 168 hours. Cardiac Enzymes: No results for input(s): CKTOTAL, CKMB, CKMBINDEX, TROPONINI in the last 168 hours. BNP (last 3 results) No results for input(s): PROBNP in the last 8760 hours. HbA1C: No results for input(s): HGBA1C in the last 72 hours. CBG: No results for input(s): GLUCAP in the last 168 hours. Lipid Profile: No results for input(s): CHOL, HDL, LDLCALC, TRIG, CHOLHDL, LDLDIRECT in the last 72 hours. Thyroid Function Tests: No results for input(s): TSH, T4TOTAL, FREET4, T3FREE, THYROIDAB in the last 72 hours. Anemia Panel: No results for input(s): VITAMINB12, FOLATE, FERRITIN, TIBC, IRON, RETICCTPCT in the last 72 hours. Urine analysis:    Component Value Date/Time   COLORURINE YELLOW 03/08/2016 0228   APPEARANCEUR CLEAR 03/08/2016 0228   LABSPEC 1.010 03/08/2016 0228   PHURINE 6.0 03/08/2016 0228   GLUCOSEU NEGATIVE 03/08/2016 0228   HGBUR NEGATIVE 03/08/2016 0228   BILIRUBINUR NEGATIVE 03/08/2016 0228   KETONESUR 5 (A) 03/08/2016 0228   PROTEINUR NEGATIVE 03/08/2016 0228   UROBILINOGEN 4.0 (H) 04/19/2011 2207   NITRITE NEGATIVE 03/08/2016 0228   LEUKOCYTESUR NEGATIVE 03/08/2016 0228     )No results found for this or any previous visit (from the past 240 hour(s)).    Anti-infectives    None       Radiology Studies: Dg Abdomen 1 View  Result Date: 03/09/2016 CLINICAL DATA:  46 y/o  M; constipation. EXAM: ABDOMEN - 1 VIEW COMPARISON:  02/07/2015 abdomen radiograph. FINDINGS: The bowel gas pattern is normal. No radio-opaque calculi or other significant radiographic abnormality are seen. Right upper quadrant surgical clips, presumably cholecystectomy. Right pelvic phleboliths. IMPRESSION: Negative. Electronically Signed   By: Mitzi Hansen M.D.   On: 03/09/2016 01:27   Ct Head Wo Contrast  Result Date: 03/09/2016 CLINICAL DATA:  Ataxia with increasing gait disturbance for 1 week. Decreased sensation in the hands and feet. EXAM: CT HEAD WITHOUT CONTRAST TECHNIQUE: Contiguous axial images were obtained from the base of the skull through the vertex without intravenous contrast. COMPARISON:  None. FINDINGS: Brain: There vague patchy low-attenuation changes suggested in the deep white matter. This appearance is nonspecific in could represent small vessel ischemic change or other demyelinating/ dysmyelinating process. Consider MS in the appropriate clinical setting. No evidence of acute infarction, hemorrhage, hydrocephalus, extra-axial collection or mass lesion/mass effect. Vascular: No hyperdense vessel or unexpected calcification. Skull: Normal. Negative for fracture or focal lesion. Sinuses/Orbits: Mild mucosal thickening in the paranasal sinuses. No acute air-fluid levels. Mastoid air cells are not opacified. Other: None. IMPRESSION: Suggestion  of nonspecific white matter changes. Consider MRI if clinically indicated. No acute intracranial hemorrhage or mass effect. Electronically Signed   By: Burman Nieves M.D.   On: 03/09/2016 01:44   Mr Laqueta Jean And Wo Contrast  Result Date: 03/09/2016 CLINICAL DATA:  Gait ataxia.  Symptoms for 1 week, worsening. EXAM: MRI HEAD WITHOUT AND WITH CONTRAST TECHNIQUE: Multiplanar, multiecho pulse sequences of the brain and surrounding structures were obtained without and with intravenous contrast. CONTRAST:  17mL MULTIHANCE GADOBENATE DIMEGLUMINE 529 MG/ML IV SOLN COMPARISON:  None. FINDINGS: Brain: Extensive cerebral white matter disease with the periventricular predominance. Although in places confluent, there is still distinguishable ovoid radiating periventricular lesions. DWI hyperintensity around the occipital horn of the left lateral ventricle is likely shine through. There is hazy  restricted diffusion about the atrium of the right lateral ventricle. No infratentorial signal abnormalities noted. There is at least 4 juxta cortical signal abnormalities at the vertex. On the lowest slices, there is visible paired edematous signal in the dorsal columns of the upper cervical cord. No abnormal intracranial enhancement. No acute infarction, hemorrhage, hydrocephalus, extra-axial collection or mass lesion. Vascular: Preserved flow voids Skull and upper cervical spine: Negative Sinuses/Orbits: Patchy mucosal thickening with fluid levels in the right sphenoid sinus lateral recesses. Opacification of left petrous apex air cells without obstructive or expansive changes. Mucous retention cysts in the left maxillary sinus. IMPRESSION: 1. Extensive white matter disease with demyelinating pattern, especially multiple sclerosis. Right periventricular diffusion signal implies active demyelination. 2. Bilateral dorsal column signal abnormality, partly visualized. Recommend cord imaging. Electronically Signed   By: Marnee Spring M.D.   On: 03/09/2016 07:07   Mr Cervical Spine W Wo Contrast  Result Date: 03/10/2016 CLINICAL DATA:  Bilateral lower extremity weakness. Abnormal brain MRI a yesterday. EXAM: MRI CERVICAL SPINE WITHOUT AND WITH CONTRAST TECHNIQUE: Multiplanar and multiecho pulse sequences of the cervical spine, to include the craniocervical junction and cervicothoracic junction, were obtained without and with intravenous contrast. CONTRAST:  15mL MULTIHANCE GADOBENATE DIMEGLUMINE 529 MG/ML IV SOLN COMPARISON:  Brain MRI yesterday and thoracic study today. FINDINGS: Alignment: Normal Vertebrae: No fracture or primary lesion. Cord: Abnormal T2 signal in the dorsal cord beginning at the foramen magnum and extending as low as C7. The appearance could be due to demyelinating disease, but could be also characteristic of subacute combined degeneration. No mass effect or abnormal enhancement. Posterior  Fossa, vertebral arteries, paraspinal tissues: Normal Disc levels: Ordinary spondylosis from C3-4 through C7-T1. Mild narrowing of the ventral subarachnoid space but no compressive stenosis. Mild foraminal encroachment by osteophytes. IMPRESSION: Abnormal signal in the dorsal cord from the foramen magnum to C7. Whereas this could be due to demyelinating disease, the appearance is suggestive of subacute combined degeneration. Electronically Signed   By: Paulina Fusi M.D.   On: 03/10/2016 11:56   Mr Thoracic Spine W Wo Contrast  Result Date: 03/10/2016 CLINICAL DATA:  cord lesion questioned on examination yesterday. Decreased sensation in the lower extremities. EXAM: MRI THORACIC SPINE WITH and without CONTRAST CONTRAST:  15 cc MultiHance TECHNIQUE: Multiplanar, multisequence MR imaging of the thoracic spine was performed following the administration of intravenous contrast. COMPARISON:  MRI 03/09/2016 FINDINGS: Alignment:  Normal Vertebrae: No fracture or primary lesion. Cord: Subtle abnormal signal within the dorsal, [place] particularly evident in the region from T7-T10. The appearance is nonspecific and could be secondary to demyelinating disease or subacute combined degeneration. No mass effect. No abnormal enhancement. Paraspinal and other soft tissues: Negative Disc levels: No  degenerative disc disease.  No stenosis. IMPRESSION: Abnormal signal within the posterior column of the spinal cord in the region from T7-T10. Findings could be due to demyelinating disease or subacute combined degeneration. Electronically Signed   By: Paulina Fusi M.D.   On: 03/10/2016 11:53        Scheduled Meds: . methylPREDNISolone (SOLU-MEDROL) injection  1,000 mg Intravenous Daily  . pantoprazole  40 mg Oral BID  . polyethylene glycol  17 g Oral BID  . senna-docusate  2 tablet Oral BID   Continuous Infusions:   LOS: 1 day    Time spent: 25 min    Ascher Schroepfer U Vici Novick, DO [facility] Pager [phone]  If  7PM-7AM, please contact night-coverage [email or web address] Password [email or web address] 03/10/2016, 12:05 PM

## 2016-03-10 NOTE — Progress Notes (Signed)
PT Cancellation Note  Patient Details Name: Daniel Morales MRN: [id number] DOB: [birth year]   Cancelled Treatment:    Reason Eval/Treat Not Completed: Patient at procedure or test/unavailable (off floor for imaging)   [facility] 03/10/2016, 11:02 AM

## 2016-03-11 DIAGNOSIS — D519 Vitamin B12 deficiency anemia, unspecified: Secondary | ICD-10-CM

## 2016-03-11 DIAGNOSIS — E538 Deficiency of other specified B group vitamins: Secondary | ICD-10-CM

## 2016-03-11 DIAGNOSIS — G320 Subacute combined degeneration of spinal cord in diseases classified elsewhere: Secondary | ICD-10-CM

## 2016-03-11 LAB — VITAMIN B12

## 2016-03-11 MED ORDER — CYANOCOBALAMIN 1000 MCG/ML IJ SOLN
1000.0000 ug | INTRAMUSCULAR | Status: DC
  Administered 2016-03-11: 1000 ug via INTRAMUSCULAR
  Filled 2016-03-11: qty 1

## 2016-03-11 MED ORDER — CYCLOBENZAPRINE HCL 10 MG PO TABS
10.0000 mg | ORAL_TABLET | Freq: Two times a day (BID) | ORAL | Status: DC
  Administered 2016-03-11 – 2016-03-14 (×7): 10 mg via ORAL
  Filled 2016-03-11 (×7): qty 1

## 2016-03-11 NOTE — Progress Notes (Signed)
PROGRESS NOTE    Daniel Morales  WUJ:[id number] DOB: 1970/07/11 DOA: 03/09/2016 PCP: No PCP Per Patient   Outpatient Specialists:     Brief Narrative:  Daniel Morales  is a 46 y.o. male, Without significant past medical history, presents with multiple complaints, including small amount of blood only when he wipes, abdominal pain after bowel movement, he reports constipation for a few days, he denies any melena, bright red blood per rectum, no nausea, no vomiting, no coffee-ground emesis, patient does have hemorrhoids on physical exam, hemoglobin is 11.1, baseline 15-16.   Assessment & Plan:   Active Problems:   Ataxic gait   Multiple sclerosis (HCC)   Constipation   Ataxic gait MRI evidence of multiple sclerosis with active demyelination. - Impression presents with ataxia and unsteady gait over last 1-1/2 week, MRI brain with evidence of findings suggestive of multiple sclerosis with active demyelination -neuro eval - IV Solu-Medrol 1 g daily x 5 days-- will need taper at end per neuro -Protonix 40 twice a day -MRI cervical/thoracic spine with and without contrast: Abnormal signal within the posterior column of the spinal cord in the region from T7-T10. Findings could be due to demyelinating disease or subacute combined degeneration. Abnormal signal in the dorsal cord from the foramen magnum to C7.   Blood in stool - Patient reports some blood on the napkins when he wipes, otherwise denies any bright red blood per rectum or melena, he does have hemorrhoids, has been recently constipated, will start him on laxative, monitor H&H closely. -anusol  Low B12 -< 50 -IM B12   DVT prophylaxis:  SCD's  Code Status: Full Code   Family Communication:   Disposition Plan:     Consultants:   neuro   Subjective: Bowels moving now   Objective: Vitals:   03/10/16 2039 03/10/16 2324 03/11/16 0456 03/11/16 1014  BP: 132/82 128/81 126/82 114/74  Pulse: 94 88 73  73  Resp: 18 18 18 18   Temp: 97.5 F (36.4 C) 99.3 F (37.4 C) 98.6 F (37 C) 98.7 F (37.1 C)  TempSrc: Oral Oral Oral Oral  SpO2: 100% 100% 99% 99%  Weight:      Height:        Intake/Output Summary (Last 24 hours) at 03/11/16 1157 Last data filed at 03/11/16 0818  Gross per 24 hour  Intake              350 ml  Output                0 ml  Net              350 ml   Filed Weights   03/08/16 1826 03/09/16 2200  Weight: 81.6 kg (180 lb) 78.7 kg (173 lb 6.4 oz)    Examination:  General exam: Appears calm and comfortable  Respiratory system: Clear to auscultation. Respiratory effort normal. Cardiovascular system: S1 & S2 heard, RRR. No JVD, murmurs, rubs, gallops or clicks. No pedal edema. Gastrointestinal system: Abdomen is nondistended, soft and nontender. No organomegaly or masses felt. Normal bowel sounds heard.      Data Reviewed: I have personally reviewed following labs and imaging studies  CBC:  Recent Labs Lab 03/08/16 1836 03/09/16 1815  WBC 3.1*  --   HGB 11.1* 12.1*  HCT 32.1* 35.8*  MCV 108.4*  --   PLT 113*  --    Basic Metabolic Panel:  Recent Labs Lab 03/08/16 1836  NA 142  K 3.7  CL 108  CO2 25  GLUCOSE 88  BUN 13  CREATININE 0.97  CALCIUM 9.1   GFR: Estimated Creatinine Clearance: 98.3 mL/min (by C-G formula based on SCr of 0.97 mg/dL). Liver Function Tests:  Recent Labs Lab 03/08/16 1836  AST 22  ALT 19  ALKPHOS 82  BILITOT 0.9  PROT 7.3  ALBUMIN 4.6    Recent Labs Lab 03/08/16 1836  LIPASE 17   No results for input(s): AMMONIA in the last 168 hours. Coagulation Profile: No results for input(s): INR, PROTIME in the last 168 hours. Cardiac Enzymes: No results for input(s): CKTOTAL, CKMB, CKMBINDEX, TROPONINI in the last 168 hours. BNP (last 3 results) No results for input(s): PROBNP in the last 8760 hours. HbA1C: No results for input(s): HGBA1C in the last 72 hours. CBG: No results for input(s): GLUCAP in the  last 168 hours. Lipid Profile: No results for input(s): CHOL, HDL, LDLCALC, TRIG, CHOLHDL, LDLDIRECT in the last 72 hours. Thyroid Function Tests: No results for input(s): TSH, T4TOTAL, FREET4, T3FREE, THYROIDAB in the last 72 hours. Anemia Panel:  Recent Labs  03/11/16 0734  VITAMINB12 <50*   Urine analysis:    Component Value Date/Time   COLORURINE YELLOW 03/08/2016 0228   APPEARANCEUR CLEAR 03/08/2016 0228   LABSPEC 1.010 03/08/2016 0228   PHURINE 6.0 03/08/2016 0228   GLUCOSEU NEGATIVE 03/08/2016 0228   HGBUR NEGATIVE 03/08/2016 0228   BILIRUBINUR NEGATIVE 03/08/2016 0228   KETONESUR 5 (A) 03/08/2016 0228   PROTEINUR NEGATIVE 03/08/2016 0228   UROBILINOGEN 4.0 (H) 04/19/2011 2207   NITRITE NEGATIVE 03/08/2016 0228   LEUKOCYTESUR NEGATIVE 03/08/2016 0228     )No results found for this or any previous visit (from the past 240 hour(s)).    Anti-infectives    None       Radiology Studies: Mr Cervical Spine W Wo Contrast  Result Date: 03/10/2016 CLINICAL DATA:  Bilateral lower extremity weakness. Abnormal brain MRI a yesterday. EXAM: MRI CERVICAL SPINE WITHOUT AND WITH CONTRAST TECHNIQUE: Multiplanar and multiecho pulse sequences of the cervical spine, to include the craniocervical junction and cervicothoracic junction, were obtained without and with intravenous contrast. CONTRAST:  3mL MULTIHANCE GADOBENATE DIMEGLUMINE 529 MG/ML IV SOLN COMPARISON:  Brain MRI yesterday and thoracic study today. FINDINGS: Alignment: Normal Vertebrae: No fracture or primary lesion. Cord: Abnormal T2 signal in the dorsal cord beginning at the foramen magnum and extending as low as C7. The appearance could be due to demyelinating disease, but could be also characteristic of subacute combined degeneration. No mass effect or abnormal enhancement. Posterior Fossa, vertebral arteries, paraspinal tissues: Normal Disc levels: Ordinary spondylosis from C3-4 through C7-T1. Mild narrowing of the ventral  subarachnoid space but no compressive stenosis. Mild foraminal encroachment by osteophytes. IMPRESSION: Abnormal signal in the dorsal cord from the foramen magnum to C7. Whereas this could be due to demyelinating disease, the appearance is suggestive of subacute combined degeneration. Electronically Signed   By: Paulina Fusi M.D.   On: 03/10/2016 11:56   Mr Thoracic Spine W Wo Contrast  Result Date: 03/10/2016 CLINICAL DATA:  cord lesion questioned on examination yesterday. Decreased sensation in the lower extremities. EXAM: MRI THORACIC SPINE WITH and without CONTRAST CONTRAST:  15 cc MultiHance TECHNIQUE: Multiplanar, multisequence MR imaging of the thoracic spine was performed following the administration of intravenous contrast. COMPARISON:  MRI 03/09/2016 FINDINGS: Alignment:  Normal Vertebrae: No fracture or primary lesion. Cord: Subtle abnormal signal within the dorsal, Lyme particularly evident in the region from T7-T10. The appearance  is nonspecific and could be secondary to demyelinating disease or subacute combined degeneration. No mass effect. No abnormal enhancement. Paraspinal and other soft tissues: Negative Disc levels: No degenerative disc disease.  No stenosis. IMPRESSION: Abnormal signal within the posterior column of the spinal cord in the region from T7-T10. Findings could be due to demyelinating disease or subacute combined degeneration. Electronically Signed   By: Paulina Fusi M.D.   On: 03/10/2016 11:53        Scheduled Meds: . cyanocobalamin  1,000 mcg Intramuscular QODAY  . cyclobenzaprine  10 mg Oral BID  . hydrocortisone  25 mg Rectal BID  . methylPREDNISolone (SOLU-MEDROL) injection  1,000 mg Intravenous Daily  . pantoprazole  40 mg Oral BID  . senna-docusate  2 tablet Oral BID   Continuous Infusions:   LOS: 2 days    Time spent: 25 min    Gaige Fussner U Jasmina Gendron, DO [facility] Pager [phone]  If 7PM-7AM, please contact  night-coverage [email or web address] Password [facility] 03/11/2016, 11:57 AM

## 2016-03-11 NOTE — Evaluation (Signed)
Physical Therapy Evaluation Patient Details Name: Daniel Morales MRN: [id number] DOB: [birth year] Today's Date: 03/11/2016   History of Present Illness  Patient is a 46 y/o male admitted with c/o constipation and some blood with BMs and c/o ataxic gait.   MRI evidence of multiple sclerosis with active demyelination.  Clinical Impression  Patient presents with balance and gait abnormality along with decreased sensation in the LE's and general weakness.  Feel he will benefit from skilled PT in the acute setting to allow return home with intermittent family support and follow up HHPT.     Follow Up Recommendations Home health PT    Equipment Recommendations  Rolling walker with 5" wheels    Recommendations for Other Services       Precautions / Restrictions Precautions Precautions: Fall      Mobility  Bed Mobility Overal bed mobility: Modified Independent                Transfers Overall transfer level: Needs assistance Equipment used: None Transfers: Sit to/from Stand Sit to Stand: Min guard         General transfer comment: for balance, no device  Ambulation/Gait Ambulation/Gait assistance: Min guard;Supervision Ambulation Distance (Feet): 250 Feet Assistive device: None;Rolling walker (2 wheeled) Gait Pattern/deviations: Step-through pattern;Decreased stride length;Shuffle;Wide base of support     General Gait Details: initially without device noted unsteady and requiring some support for safety with shortened step length and L lateral heel whip; improved confidence, step length, safety and independence walking with RW  Stairs            Wheelchair Mobility    Modified Rankin (Stroke Patients Only)       Balance Overall balance assessment: Needs assistance                               Standardized Balance Assessment Standardized Balance Assessment : Berg Balance Test Berg Balance Test Sitting with Back Unsupported but Feet  Supported on Floor or Stool: Able to sit safely and securely 2 minutes Standing Unsupported with Eyes Closed: Able to stand 10 seconds with supervision Standing Ubsupported with Feet Together: Able to place feet together independently and stand for 1 minute with supervision From Standing, Reach Forward with Outstretched Arm: Loses balance while trying/requires external support From Standing Position, Pick up Object from Floor: Able to pick up shoe, needs supervision From Standing Position, Turn to Look Behind Over each Shoulder: Looks behind from both sides and weight shifts well Turn 360 Degrees: Needs close supervision or verbal cueing Standing Unsupported, One Foot in Front: Able to take small step independently and hold 30 seconds         Pertinent Vitals/Pain Pain Assessment: No/denies pain    Home Living Family/patient expects to be discharged to:: Private residence Living Arrangements: Spouse/significant other Available Help at Discharge: Family Type of Home: House Home Access: Level entry     Home Layout: One level Home Equipment: Gilmer Mor - single point      Prior Function Level of Independence: Independent         Comments: works as Naval architect, Pharmacist, community   Dominant Hand: Right    Extremity/Trunk Assessment   Upper Extremity Assessment Upper Extremity Assessment: RUE deficits/detail;LUE deficits/detail RUE Deficits / Details: weaker grip on R, shoulder flexion strength 4+/5 LUE Deficits / Details: grossly [facility]    Lower Extremity Assessment Lower Extremity Assessment: RLE deficits/detail;LLE  deficits/detail RLE Deficits / Details: AROM WFL, strength hip flexion 4-/5, knee extension 4+/5, ankle DF 4-/5 RLE Sensation: decreased light touch LLE Deficits / Details: strength hip flexion 4/5, knee extension 4+/5, ankle DF 4/5 LLE Sensation: decreased light touch    Cervical / Trunk Assessment Cervical / Trunk Assessment: Other  exceptions Cervical / Trunk Exceptions: exhibits some tremors in trunk and extremities at times  Communication   Communication: No difficulties  Cognition Arousal/Alertness: Awake/alert Behavior During Therapy: WFL for tasks assessed/performed Overall Cognitive Status: No family/caregiver present to determine baseline cognitive functioning                 General Comments: patient slow to respond to questions, low voice volume, reports he is here due to constipation    General Comments      Exercises     Assessment/Plan    PT Assessment Patient needs continued PT services  PT Problem List Decreased strength;Decreased balance;Decreased mobility;Decreased knowledge of use of DME;Decreased safety awareness          PT Treatment Interventions DME instruction;Gait training;Stair training;Balance training;Therapeutic exercise;Patient/family education;Therapeutic activities;Functional mobility training    PT Goals (Current goals can be found in the Care Plan section)  Acute Rehab PT Goals Patient Stated Goal: To return to independent PT Goal Formulation: With patient Time For Goal Achievement: 03/18/16 Potential to Achieve Goals: Good    Frequency Min 3X/week   Barriers to discharge Decreased caregiver support wife works    Co-evaluation               End of Session Equipment Utilized During Treatment: Gait belt Activity Tolerance: Patient tolerated treatment well Patient left: in bed;with call bell/phone within reach           Time: 1456-1523 PT Time Calculation (min) (ACUTE ONLY): 27 min   Charges:   PT Evaluation $PT Eval Moderate Complexity: 1 Procedure PT Treatments $Gait Training: 8-22 mins   PT G CodesElray Mcgregor 03/20/2016, 5:02 PM  Sheran Lawless, PT [phone] 20-Mar-2016

## 2016-03-11 NOTE — Progress Notes (Signed)
Subjective: Interval History:  No acute events overnight. MRI of the C and T spine done last night. Pt is complaining of bilateral thigh pain when moving. Feels as if things are Morales on his muscles. Denied any bowel and bladder incontinence. He has been trying to avoid eating red meat due to constipation.  Objective: Vital signs in last 24 hours: Temp:  [97.5 F (36.4 C)-99.3 F (37.4 C)] 98.6 F (37 C) (02/08 0456) Pulse Rate:  [73-94] 73 (02/08 0456) Resp:  [16-18] 18 (02/08 0456) BP: (117-132)/(73-83) 126/82 (02/08 0456) SpO2:  [98 %-100 %] 99 % (02/08 0456)  Intake/Output from previous day: 02/07 0701 - 02/08 0700 In: 480 [P.O.:480] Out: -  Intake/Output this shift: Total I/O In: 350 [P.O.:350] Out: -  Nutritional status: Diet regular Room service appropriate? Yes; Fluid consistency: Thin  Neurologic Examination: Mental status: Awake alert and oriented to all spheres. Speech and language: No evidence of dysarthria was appreciated. Comprehension intact. Naming intact. Fluent. Cranial nerves: Pupils approximately 2 mm and reactive to light. Extra muscles intact. Facial sensation symmetric. No facial droop is appreciated. Hearing intact. Uvula midline. Tongue midline. Motor: 5/5 throughout except bilateral hamstrings 4/5 limited due to pain. Sensory: Normal sensation to light touch. Absent vibration in both legs up to hip joint. DTRs: Brisk in bilateral lower extremities. Gait: Deferred  Lab Results:  Recent Labs  03/08/16 1836 03/09/16 1815  WBC 3.1*  --   HGB 11.1* 12.1*  HCT 32.1* 35.8*  PLT 113*  --   NA 142  --   K 3.7  --   CL 108  --   CO2 25  --   GLUCOSE 88  --   BUN 13  --   CREATININE 0.97  --   CALCIUM 9.1  --    Lipid Panel No results for input(s): CHOL, TRIG, HDL, CHOLHDL, VLDL, LDLCALC in the last 72 hours.  Studies/Results: Mr Cervical Spine W Wo Contrast  Result Date: 03/10/2016 CLINICAL DATA:  Bilateral lower extremity weakness. Abnormal  brain MRI a yesterday. EXAM: MRI CERVICAL SPINE WITHOUT AND WITH CONTRAST TECHNIQUE: Multiplanar and multiecho pulse sequences of the cervical spine, to include the craniocervical junction and cervicothoracic junction, were obtained without and with intravenous contrast. CONTRAST:  15mL MULTIHANCE GADOBENATE DIMEGLUMINE 529 MG/ML IV SOLN COMPARISON:  Brain MRI yesterday and thoracic study today. FINDINGS: Alignment: Normal Vertebrae: No fracture or primary lesion. Cord: Abnormal T2 signal in the dorsal cord beginning at the foramen magnum and extending as low as C7. The appearance could be due to demyelinating disease, but could be also characteristic of subacute combined degeneration. No mass effect or abnormal enhancement. Posterior Fossa, vertebral arteries, paraspinal tissues: Normal Disc levels: Ordinary spondylosis from C3-4 through C7-T1. Mild narrowing of the ventral subarachnoid space but no compressive stenosis. Mild foraminal encroachment by osteophytes. IMPRESSION: Abnormal signal in the dorsal cord from the foramen magnum to C7. Whereas this could be due to demyelinating disease, the appearance is suggestive of subacute combined degeneration. Electronically Signed   By: Paulina Fusi M.D.   On: 03/10/2016 11:56   Mr Thoracic Spine W Wo Contrast  Result Date: 03/10/2016 CLINICAL DATA:  cord lesion questioned on examination yesterday. Decreased sensation in the lower extremities. EXAM: MRI THORACIC SPINE WITH and without CONTRAST CONTRAST:  15 cc MultiHance TECHNIQUE: Multiplanar, multisequence MR imaging of the thoracic spine was performed following the administration of intravenous contrast. COMPARISON:  MRI 03/09/2016 FINDINGS: Alignment:  Normal Vertebrae: No fracture or primary lesion.  Cord: Subtle abnormal signal within the dorsal, Lyme particularly evident in the region from T7-T10. The appearance is nonspecific and could be secondary to demyelinating disease or subacute combined degeneration. No  mass effect. No abnormal enhancement. Paraspinal and other soft tissues: Negative Disc levels: No degenerative disc disease.  No stenosis. IMPRESSION: Abnormal signal within the posterior column of the spinal cord in the region from T7-T10. Findings could be due to demyelinating disease or subacute combined degeneration. Electronically Signed   By: Paulina Fusi M.D.   On: 03/10/2016 11:53    Medications:  Scheduled: . hydrocortisone  25 mg Rectal BID  . methylPREDNISolone (SOLU-MEDROL) injection  1,000 mg Intravenous Daily  . pantoprazole  40 mg Oral BID  . polyethylene glycol  17 g Oral BID  . senna-docusate  2 tablet Oral BID   Continuous:  DJS:[id number]  Assessment/Plan: 46 years old male with no prior history presented with ataxia. MRI is suggestive of demyelination however spinal cord showed dorsal column hyperintensity. On exam he has absent vibratory sensation and brisk reflexes fitting in with subacute combined degeneration. I think the Brain MRI is also likely related to Vitamin B12 deficiency.  -Sent off B12 and MMA levels. -Starting B12 injections every other day for one month then 1019mcg/month. -May continue with steroids for now due to active demyelination in brain and unclear ?benefit from it at this time. -Starting Flexaril 10mg  BID which can increased to TID dosing after 1 week for muscle relaxation. -PT/OT aggressive therapy is recommended. -Close follow up with Neurology and primary physician is recommended.   LOS: 2 days   Daniel Morales

## 2016-03-12 MED ORDER — TRAMADOL HCL 50 MG PO TABS
50.0000 mg | ORAL_TABLET | Freq: Four times a day (QID) | ORAL | Status: DC | PRN
  Administered 2016-03-12 – 2016-03-13 (×2): 50 mg via ORAL
  Filled 2016-03-12 (×2): qty 1

## 2016-03-12 NOTE — Progress Notes (Addendum)
PROGRESS NOTE    Daniel Morales  HCW:[id number] DOB: 1970-04-29 DOA: 03/09/2016 PCP: Georgianne Fick, MD   Outpatient Specialists:     Brief Narrative:  Daniel Morales  is a 46 y.o. male, Without significant past medical history, presents with multiple complaints, including small amount of blood only when he wipes, abdominal pain after bowel movement, he reports constipation for a few days, he denies any melena, bright red blood per rectum, no nausea, no vomiting, no coffee-ground emesis, patient does have hemorrhoids on physical exam, hemoglobin is 11.1, baseline 15-16.   Assessment & Plan:   Active Problems:   Ataxic gait   Constipation   Anemia due to vitamin B12 deficiency   Ataxic gait MRI evidence of multiple sclerosis vs subacute combined degeneration . - ataxia and unsteady gait over last 1-1/2 week, MRI brain with evidence of findings suggestive of multiple sclerosis with active demyelination vs subacute combined degeneration -neuro eval appreciated - IV Solu-Medrol 1 g daily x 5 days (last done 2/10)-- no need for taper -Protonix 40 twice a day -B12<50- currently on B12 shots QOD- PCP office closed but left message with answering service to get patient in on Monday if possible-- both patient and wife did not think they could do the shot at home  Blood in stool - Patient reports some blood on the napkins when he wipes, otherwise denies any bright red blood per rectum or melena, he does have hemorrhoids, has been recently constipated, will start him on laxative, monitor H&H closely. -anusol  Low B12 -< 50 -IM B12- will need outpatient follow up -MMA pending  DVT prophylaxis:  SCD's  Code Status: Full Code   Family Communication: Wife at bedside  Disposition Plan:     Consultants:   neuro   Subjective: Feels much stronger, no complaints   Objective: Vitals:   03/11/16 2139 03/12/16 0056 03/12/16 0635 03/12/16 0954  BP: 122/80 122/78  127/82 118/74  Pulse: 65 71 76 68  Resp: 16 16 16 18   Temp: 98.1 F (36.7 C) 98.5 F (36.9 C) 99 F (37.2 C) 98.6 F (37 C)  TempSrc: Oral Oral Oral Oral  SpO2: 100% 98% 100% 100%  Weight:      Height:        Intake/Output Summary (Last 24 hours) at 03/12/16 1246 Last data filed at 03/12/16 0900  Gross per 24 hour  Intake              360 ml  Output                0 ml  Net              360 ml   Filed Weights   03/08/16 1826 03/09/16 2200  Weight: 81.6 kg (180 lb) 78.7 kg (173 lb 6.4 oz)    Examination:  General exam: Appears calm and comfortable  Respiratory system: Clear to auscultation. Respiratory effort normal. Cardiovascular system: S1 & S2 heard, RRR. No JVD, murmurs, rubs, gallops or clicks. No pedal edema. Gastrointestinal system: Abdomen is nondistended, soft and nontender. No organomegaly or masses felt. Normal bowel sounds heard.      Data Reviewed: I have personally reviewed following labs and imaging studies  CBC:  Recent Labs Lab 03/08/16 1836 03/09/16 1815  WBC 3.1*  --   HGB 11.1* 12.1*  HCT 32.1* 35.8*  MCV 108.4*  --   PLT 113*  --    Basic Metabolic Panel:  Recent Labs Lab 03/08/16 1836  NA 142  K 3.7  CL 108  CO2 25  GLUCOSE 88  BUN 13  CREATININE 0.97  CALCIUM 9.1   GFR: Estimated Creatinine Clearance: 98.3 mL/min (by C-G formula based on SCr of 0.97 mg/dL). Liver Function Tests:  Recent Labs Lab 03/08/16 1836  AST 22  ALT 19  ALKPHOS 82  BILITOT 0.9  PROT 7.3  ALBUMIN 4.6    Recent Labs Lab 03/08/16 1836  LIPASE 17   No results for input(s): AMMONIA in the last 168 hours. Coagulation Profile: No results for input(s): INR, PROTIME in the last 168 hours. Cardiac Enzymes: No results for input(s): CKTOTAL, CKMB, CKMBINDEX, TROPONINI in the last 168 hours. BNP (last 3 results) No results for input(s): PROBNP in the last 8760 hours. HbA1C: No results for input(s): HGBA1C in the last 72 hours. CBG: No  results for input(s): GLUCAP in the last 168 hours. Lipid Profile: No results for input(s): CHOL, HDL, LDLCALC, TRIG, CHOLHDL, LDLDIRECT in the last 72 hours. Thyroid Function Tests: No results for input(s): TSH, T4TOTAL, FREET4, T3FREE, THYROIDAB in the last 72 hours. Anemia Panel:  Recent Labs  03/11/16 0734  VITAMINB12 <50*   Urine analysis:    Component Value Date/Time   COLORURINE YELLOW 03/08/2016 0228   APPEARANCEUR CLEAR 03/08/2016 0228   LABSPEC 1.010 03/08/2016 0228   PHURINE 6.0 03/08/2016 0228   GLUCOSEU NEGATIVE 03/08/2016 0228   HGBUR NEGATIVE 03/08/2016 0228   BILIRUBINUR NEGATIVE 03/08/2016 0228   KETONESUR 5 (A) 03/08/2016 0228   PROTEINUR NEGATIVE 03/08/2016 0228   UROBILINOGEN 4.0 (H) 04/19/2011 2207   NITRITE NEGATIVE 03/08/2016 0228   LEUKOCYTESUR NEGATIVE 03/08/2016 0228     )No results found for this or any previous visit (from the past 240 hour(s)).    Anti-infectives    None       Radiology Studies: No results found.      Scheduled Meds: . cyanocobalamin  1,000 mcg Intramuscular QODAY  . cyclobenzaprine  10 mg Oral BID  . hydrocortisone  25 mg Rectal BID  . methylPREDNISolone (SOLU-MEDROL) injection  1,000 mg Intravenous Daily  . pantoprazole  40 mg Oral BID  . senna-docusate  2 tablet Oral BID   Continuous Infusions:   LOS: 3 days    Time spent: 25 min    Kort Stettler U Raegan Sipp, DO [facility] Pager [phone]  If 7PM-7AM, please contact night-coverage [email or web address] Password [email or web address] 03/12/2016, 12:46 PM

## 2016-03-12 NOTE — Progress Notes (Signed)
Physical Therapy Treatment Patient Details Name: Danilo Cappiello MRN: [id number] DOB: 07-31-1970 Today's Date: 03/12/2016    History of Present Illness Patient is a 46 y/o male admitted with c/o constipation and some blood with BMs and c/o ataxic gait.   MRI evidence of multiple sclerosis with active demyelination. Pt also has vitamin B12 deficiency.    PT Comments    Pt tolerated increased distance of 325' with ambulation with RW. He reports 8/10 back and posterior shoulder pain, RN notified of request for pain meds, hot pack applied. Will work towards walking without an assistive device.    Follow Up Recommendations  Home health PT     Equipment Recommendations  Rolling walker with 5" wheels    Recommendations for Other Services       Precautions / Restrictions Precautions Precautions: Fall Restrictions Weight Bearing Restrictions: No    Mobility  Bed Mobility               General bed mobility comments: up in chair  Transfers Overall transfer level: Modified independent Equipment used: None Transfers: Sit to/from Stand Sit to Stand: Modified independent (Device/Increase time)         General transfer comment: no assist needed, used armrests to push up  Ambulation/Gait Ambulation/Gait assistance: Modified independent (Device/Increase time) Ambulation Distance (Feet): 325 Feet Assistive device: None;Rolling walker (2 wheeled) Gait Pattern/deviations: Step-through pattern;Decreased stride length Gait velocity: decr Gait velocity interpretation: Below normal speed for age/gender General Gait Details: attempted short distance (~20') without RW however pt felt less steady and had decr velocity, used RW for 305' with no LOB   Stairs            Wheelchair Mobility    Modified Rankin (Stroke Patients Only)       Balance     Sitting balance-Leahy Scale: Good       Standing balance-Leahy Scale: Fair                       Cognition Arousal/Alertness: Awake/alert Behavior During Therapy: WFL for tasks assessed/performed Overall Cognitive Status: Within Functional Limits for tasks assessed                      Exercises      General Comments        Pertinent Vitals/Pain Pain Assessment: 0-10 Pain Score: 8  Pain Location: back and posterior shoulders Pain Descriptors / Indicators: Aching Pain Intervention(s): Limited activity within patient's tolerance;Monitored during session;Patient requesting pain meds-RN notified;Heat applied    Home Living                      Prior Function            PT Goals (current goals can now be found in the care plan section) Acute Rehab PT Goals 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 PT Goal Formulation: With patient Time For Goal Achievement: 03/18/16 Potential to Achieve Goals: Good Progress towards PT goals: Goals met and updated - see care plan    Frequency    Min 3X/week      PT Plan Current plan remains appropriate    Co-evaluation             End of Session Equipment Utilized During Treatment: Gait belt Activity Tolerance: Patient tolerated treatment well Patient left: in bed;with call bell/phone within reach;with family/visitor present     Time: 6195-0932 PT Time Calculation (min) (  ACUTE ONLY): 19 min  Charges:  $Gait Training: 8-22 mins                    G Codes:      Philomena Doheny 03/12/2016, 1:43 PM [phone]

## 2016-03-12 NOTE — Care Management Note (Signed)
Case Management Note  Patient Details  Name: Daniel Morales MRN: [id number] Date of Birth: 10-05-1970  Subjective/Objective:                    Action/Plan: Plan when patient is ready for d/c is home with [facility] services. CM met with the patients wife (pt sleeping) and provided her a list of [place] agencies. She selected [facility]. CM notified Karolee Stamps with [facility] and she accepted the referral. Pt also with orders for walker. Jermaine with [facility] notified and will deliver the equipment to the room.   Expected Discharge Date:   (unknown)               Expected Discharge Plan:  [place]  In-House Referral:     Discharge planning Services  CM Consult  Post Acute Care Choice:  Durable Medical Equipment, Home Health Choice offered to:  Spouse  DME Arranged:  Walker rolling DME Agency:  [place] Arranged:  PT [place]:  [place]  Status of Service:  Completed, signed off  If discussed at [place] of Stay Meetings, dates discussed:    Additional Comments:  Pollie Friar, RN 03/12/2016, 2:42 PM

## 2016-03-13 DIAGNOSIS — G379 Demyelinating disease of central nervous system, unspecified: Secondary | ICD-10-CM

## 2016-03-13 MED ORDER — CYANOCOBALAMIN 1000 MCG/ML IJ SOLN
1000.0000 ug | Freq: Every day | INTRAMUSCULAR | Status: DC
  Administered 2016-03-13 – 2016-03-14 (×2): 1000 ug via INTRAMUSCULAR
  Filled 2016-03-13 (×2): qty 1

## 2016-03-13 MED ORDER — CYANOCOBALAMIN 1000 MCG/ML IJ SOLN: 1000.0000 ug | Freq: Once | INTRAMUSCULAR | Status: DC

## 2016-03-13 MED ORDER — METHYLPREDNISOLONE SODIUM SUCC 1000 MG IJ SOLR
1000.0000 mg | Freq: Every day | INTRAMUSCULAR | Status: AC
  Administered 2016-03-13 – 2016-03-14 (×2): 1000 mg via INTRAVENOUS
  Filled 2016-03-13 (×2): qty 8

## 2016-03-13 NOTE — Progress Notes (Addendum)
PROGRESS NOTE    Daniel Morales  ZOX:[id number] DOB: 1970/06/12 DOA: 03/09/2016 PCP: Georgianne Fick, MD   Outpatient Specialists:     Brief Narrative:  Daniel Morales  is a 46 y.o. male, Without significant past medical history, presents with multiple complaints, including small amount of blood only when he wipes, abdominal pain after bowel movement, he reports constipation for a few days, he denies any melena, bright red blood per rectum, no nausea, no vomiting, no coffee-ground emesis, patient does have hemorrhoids on physical exam, hemoglobin is 11.1, baseline 15-16.   Assessment & Plan:   Active Problems:   Ataxic gait   Constipation   Anemia due to vitamin B12 deficiency   Demyelinating disease (HCC)   Ataxic gait MRI evidence of multiple sclerosis vs subacute combined degeneration . - ataxia and unsteady gait over last 1-1/2 week, MRI brain with evidence of findings suggestive of multiple sclerosis with active demyelination vs subacute combined degeneration -neuro eval appreciated -Patient has been receiving IV Solu-Medrol 1 g daily x 5 days (started 2/7)-- . They feel that the changes on the MRI are related to vitamin B-12 deficiency. Steroids will be continued for total of 5 days as recommended by neurology -Protonix 40 twice a day -B12<50- currently on B12 shots QOD-will change to daily.      Blood in stool - Patient reports some blood on the napkins when he wipes, otherwise denies any bright red blood per rectum or melena, he does have hemorrhoids, has been recently constipated, will start him on laxative, follow CBC closely -anusol, now dc as patient is refusing to use it Vitamin B-12<50. Therefore MCV elevated  -IM B12- will need outpatient follow up -MMA pending  DVT prophylaxis:  SCD's  Code Status: Full Code   Family Communication: Wife at bedside  Disposition Plan:  Anticipate discharge after completion of steroids  tomorrow   Consultants:   neuro   Subjective: Feels much stronger,has right arm pain today   Objective: Vitals:   03/12/16 1753 03/12/16 2044 03/13/16 0142 03/13/16 0446  BP: 131/78 120/78 128/85 (!) 136/91  Pulse: 85 70 65 63  Resp: 18 18 18 18   Temp: 98.5 F (36.9 C) 99.1 F (37.3 C) 99 F (37.2 C) 98.5 F (36.9 C)  TempSrc: Oral Oral Oral Oral  SpO2: 100% 100% 99% 100%  Weight:      Height:        Intake/Output Summary (Last 24 hours) at 03/13/16 0852 Last data filed at 03/12/16 0900  Gross per 24 hour  Intake              240 ml  Output                0 ml  Net              240 ml   Filed Weights   03/08/16 1826 03/09/16 2200  Weight: 81.6 kg (180 lb) 78.7 kg (173 lb 6.4 oz)    Examination:  General exam: Appears calm and comfortable  Respiratory system: Clear to auscultation. Respiratory effort normal. Cardiovascular system: S1 & S2 heard, RRR. No JVD, murmurs, rubs, gallops or clicks. No pedal edema. Gastrointestinal system: Abdomen is nondistended, soft and nontender. No organomegaly or masses felt. Normal bowel sounds heard.      Data Reviewed: I have personally reviewed following labs and imaging studies  CBC:  Recent Labs Lab 03/08/16 1836 03/09/16 1815  WBC 3.1*  --   HGB 11.1* 12.1*  HCT  32.1* 35.8*  MCV 108.4*  --   PLT 113*  --    Basic Metabolic Panel:  Recent Labs Lab 03/08/16 1836  NA 142  K 3.7  CL 108  CO2 25  GLUCOSE 88  BUN 13  CREATININE 0.97  CALCIUM 9.1   GFR: Estimated Creatinine Clearance: 98.3 mL/min (by C-G formula based on SCr of 0.97 mg/dL). Liver Function Tests:  Recent Labs Lab 03/08/16 1836  AST 22  ALT 19  ALKPHOS 82  BILITOT 0.9  PROT 7.3  ALBUMIN 4.6    Recent Labs Lab 03/08/16 1836  LIPASE 17   No results for input(s): AMMONIA in the last 168 hours. Coagulation Profile: No results for input(s): INR, PROTIME in the last 168 hours. Cardiac Enzymes: No results for input(s):  CKTOTAL, CKMB, CKMBINDEX, TROPONINI in the last 168 hours. BNP (last 3 results) No results for input(s): PROBNP in the last 8760 hours. HbA1C: No results for input(s): HGBA1C in the last 72 hours. CBG: No results for input(s): GLUCAP in the last 168 hours. Lipid Profile: No results for input(s): CHOL, HDL, LDLCALC, TRIG, CHOLHDL, LDLDIRECT in the last 72 hours. Thyroid Function Tests: No results for input(s): TSH, T4TOTAL, FREET4, T3FREE, THYROIDAB in the last 72 hours. Anemia Panel:  Recent Labs  03/11/16 0734  VITAMINB12 <50*   Urine analysis:    Component Value Date/Time   COLORURINE YELLOW 03/08/2016 0228   APPEARANCEUR CLEAR 03/08/2016 0228   LABSPEC 1.010 03/08/2016 0228   PHURINE 6.0 03/08/2016 0228   GLUCOSEU NEGATIVE 03/08/2016 0228   HGBUR NEGATIVE 03/08/2016 0228   BILIRUBINUR NEGATIVE 03/08/2016 0228   KETONESUR 5 (A) 03/08/2016 0228   PROTEINUR NEGATIVE 03/08/2016 0228   UROBILINOGEN 4.0 (H) 04/19/2011 2207   NITRITE NEGATIVE 03/08/2016 0228   LEUKOCYTESUR NEGATIVE 03/08/2016 0228     )No results found for this or any previous visit (from the past 240 hour(s)).    Anti-infectives    None       Radiology Studies: No results found.      Scheduled Meds: . cyanocobalamin  1,000 mcg Intramuscular Once  . cyanocobalamin  1,000 mcg Intramuscular Daily  . cyclobenzaprine  10 mg Oral BID  . hydrocortisone  25 mg Rectal BID  . methylPREDNISolone (SOLU-MEDROL) injection  1,000 mg Intravenous Daily  . pantoprazole  40 mg Oral BID  . senna-docusate  2 tablet Oral BID   Continuous Infusions:   LOS: 4 days    Time spent: 25 min    Jonta Gastineau, MD  [facility] Pager [phone]  If 7PM-7AM, please contact night-coverage [email or web address] Password [email or web address] 03/13/2016, 8:52 AM

## 2016-03-14 ENCOUNTER — Inpatient Hospital Stay (HOSPITAL_COMMUNITY): Payer: 59

## 2016-03-14 DIAGNOSIS — R079 Chest pain, unspecified: Secondary | ICD-10-CM

## 2016-03-14 LAB — COMPREHENSIVE METABOLIC PANEL
ALBUMIN: 3.3 g/dL — AB (ref 3.5–5.0)
ALT: 15 U/L — AB (ref 17–63)
AST: 18 U/L (ref 15–41)
Alkaline Phosphatase: 63 U/L (ref 38–126)
Anion gap: 10 (ref 5–15)
BUN: 19 mg/dL (ref 6–20)
CHLORIDE: 102 mmol/L (ref 101–111)
CO2: 26 mmol/L (ref 22–32)
CREATININE: 0.94 mg/dL (ref 0.61–1.24)
Calcium: 8.7 mg/dL — ABNORMAL LOW (ref 8.9–10.3)
GFR calc non Af Amer: >60 mL/min (ref >60)
Glucose, Bld: 97 mg/dL (ref 65–99)
Potassium: 3.4 mmol/L — ABNORMAL LOW (ref 3.5–5.1)
SODIUM: 138 mmol/L (ref 135–145)
Total Bilirubin: 0.5 mg/dL (ref 0.3–1.2)
Total Protein: 6.1 g/dL — ABNORMAL LOW (ref 6.5–8.1)

## 2016-03-14 LAB — TROPONIN I: Troponin I: <0.03 ng/mL (ref <0.03)

## 2016-03-14 LAB — CBC
HCT: 31.9 % — ABNORMAL LOW (ref 39.0–52.0)
Hemoglobin: 10.7 g/dL — ABNORMAL LOW (ref 13.0–17.0)
MCH: 35.9 pg — AB (ref 26.0–34.0)
MCHC: 33.5 g/dL (ref 30.0–36.0)
MCV: 107 fL — ABNORMAL HIGH (ref 78.0–100.0)
PLATELETS: 88 10*3/uL — AB (ref 150–400)
RBC: 2.98 MIL/uL — AB (ref 4.22–5.81)
RDW: 12.6 % (ref 11.5–15.5)
WBC: 11 10*3/uL — AB (ref 4.0–10.5)

## 2016-03-14 LAB — D-DIMER, QUANTITATIVE (NOT AT ARMC): D DIMER QUANT: 2.28 ug{FEU}/mL — AB (ref 0.00–0.50)

## 2016-03-14 MED ORDER — SENNOSIDES-DOCUSATE SODIUM 8.6-50 MG PO TABS: 2.0000 | ORAL_TABLET | Freq: Two times a day (BID) | ORAL | 2 refills | Status: AC

## 2016-03-14 MED ORDER — ENOXAPARIN SODIUM 40 MG/0.4ML ~~LOC~~ SOLN
40.0000 mg | SUBCUTANEOUS | Status: DC
  Filled 2016-03-14: qty 0.4

## 2016-03-14 MED ORDER — IOPAMIDOL (ISOVUE-370) INJECTION 76%
INTRAVENOUS | Status: AC
  Administered 2016-03-14: 100 mL
  Filled 2016-03-14: qty 100

## 2016-03-14 MED ORDER — METOPROLOL TARTRATE 5 MG/5ML IV SOLN
5.0000 mg | Freq: Once | INTRAVENOUS | Status: AC
Start: 2016-03-14 — End: 2016-03-14
  Administered 2016-03-14: 5 mg via INTRAVENOUS
  Filled 2016-03-14: qty 5

## 2016-03-14 MED ORDER — ASPIRIN 325 MG PO TABS: 325.0000 mg | ORAL_TABLET | Freq: Every day | ORAL | Status: DC

## 2016-03-14 MED ORDER — CYANOCOBALAMIN 1000 MCG/ML IJ SOLN: INTRAMUSCULAR | 30 refills | Status: AC

## 2016-03-14 MED ORDER — ASPIRIN 325 MG PO TABS
325.0000 mg | ORAL_TABLET | Freq: Once | ORAL | Status: AC
  Administered 2016-03-14: 325 mg via ORAL
  Filled 2016-03-14: qty 1

## 2016-03-14 MED ORDER — PANTOPRAZOLE SODIUM 40 MG PO TBEC: 40.0000 mg | DELAYED_RELEASE_TABLET | Freq: Every day | ORAL | 2 refills | Status: DC

## 2016-03-14 MED ORDER — CYCLOBENZAPRINE HCL 10 MG PO TABS: 10.0000 mg | ORAL_TABLET | Freq: Three times a day (TID) | ORAL | 0 refills | Status: DC | PRN

## 2016-03-14 MED ORDER — NITROGLYCERIN 0.4 MG SL SUBL
0.4000 mg | SUBLINGUAL_TABLET | SUBLINGUAL | Status: DC | PRN
  Administered 2016-03-14: 0.4 mg via SUBLINGUAL
  Filled 2016-03-14: qty 1

## 2016-03-14 MED ORDER — POTASSIUM CHLORIDE CRYS ER 20 MEQ PO TBCR
40.0000 meq | EXTENDED_RELEASE_TABLET | Freq: Once | ORAL | Status: AC
Start: 2016-03-14 — End: 2016-03-14
  Administered 2016-03-14: 40 meq via ORAL
  Filled 2016-03-14: qty 2

## 2016-03-14 NOTE — Progress Notes (Signed)
VASCULAR LAB PRELIMINARY  PRELIMINARY  PRELIMINARY  PRELIMINARY  Bilateral lower extremity venous duplex completed.    Preliminary report:  There is no DVT or SVT noted in the bilateral lower extremities.   Otie Headlee, RVT 03/14/2016, 1:07 PM

## 2016-03-14 NOTE — Progress Notes (Signed)
Pt. Complaining of chest pain, which he describes as heaviness in his chest and pain/numbness in his left arm, with  BP elevated at  152/95, HR 94. Dr. Emmit Pomfret on call notified orders for asa 325mg  x 1, Lopressor 5mg  IV x1, nitro tabs every x 3 if pain persist, EKG and Troponin lab, orders executed. Pt. Denies chest pain after the first nitro tab, will continue to monitor pt. Closely.

## 2016-03-14 NOTE — Progress Notes (Signed)
Provider made aware of CT results order to D/C patient home.

## 2016-03-14 NOTE — Progress Notes (Signed)
Discharge instructions reviewed  With patient and spouse verbalized understanding. Patient D/C's home with  Script and belongings. Patient in stable condition.

## 2016-03-14 NOTE — Discharge Summary (Signed)
Physician Discharge Summary  Daniel Morales MRN: [id number] DOB/AGE: 06/21/1970 46 y.o.  PCP: Merrilee Seashore, MD   Admit date: 03/09/2016 Discharge date: 03/14/2016  Discharge Diagnoses:    Active Problems:   Ataxic gait   Constipation   Anemia due to vitamin B12 deficiency   Demyelinating disease (HCC) Thrombocytopenia likely secondary to low vitamin B-12 Microcytic anemia likely secondary to vitamin B-12 deficiency   Follow-up recommendations Follow-up with PCP in 3-5 days , including all  additional recommended appointments as below Follow-up CBC, CMP in 3-5 days PCP to further evaluate patient's vitamin B-12 deficiency, may need schillings test Patient to follow-up with neurology at [facility], [place]      Current Discharge Medication List    START taking these medications   Details  cyanocobalamin (,VITAMIN B-12,) 1000 MCG/ML injection Intramuscular daily for another 3 days, then once a week 4 weeks, then once a month Qty: 1 mL, Refills: 30    cyclobenzaprine (FLEXERIL) 10 MG tablet Take 1 tablet (10 mg total) by mouth 3 (three) times daily as needed for muscle spasms. Qty: 30 tablet, Refills: 0    pantoprazole (PROTONIX) 40 MG tablet Take 1 tablet (40 mg total) by mouth daily. Qty: 30 tablet, Refills: 2    senna-docusate (SENOKOT-S) 8.6-50 MG tablet Take 2 tablets by mouth 2 (two) times daily. Qty: 120 tablet, Refills: 2      CONTINUE these medications which have NOT CHANGED   Details  aspirin-acetaminophen-caffeine (EXCEDRIN MIGRAINE) 250-250-65 MG tablet Take 1 tablet by mouth every 6 (six) hours as needed for headache.      STOP taking these medications     ibuprofen (ADVIL,MOTRIN) 200 MG tablet      docusate sodium (COLACE) 100 MG capsule      Lidocaine, Anorectal, 5 % CREA      magnesium hydroxide (MILK OF MAGNESIA) 400 MG/5ML suspension      polyethylene glycol (MIRALAX / GLYCOLAX) packet          Discharge Condition:Stable   Discharge  Instructions Get Medicines reviewed and adjusted: Please take all your medications with you for your next visit with your Primary MD  Please request your Primary MD to go over all hospital tests and procedure/radiological results at the follow up, please ask your Primary MD to get all Hospital records sent to his/her office.  If you experience worsening of your admission symptoms, develop shortness of breath, life threatening emergency, suicidal or homicidal thoughts you must seek medical attention immediately by calling 911 or calling your MD immediately if symptoms less severe.  You must read complete instructions/literature along with all the possible adverse reactions/side effects for all the Medicines you take and that have been prescribed to you. Take any new Medicines after you have completely understood and accpet all the possible adverse reactions/side effects.   Do not drive when taking Pain medications.   Do not take more than prescribed Pain, Sleep and Anxiety Medications  Special Instructions: If you have smoked or chewed Tobacco in the last 2 yrs please stop smoking, stop any regular Alcohol and or any Recreational drug use.  Wear Seat belts while driving.  Please note  You were cared for by a hospitalist during your hospital stay. Once you are discharged, your primary care physician will handle any further medical issues. Please note that NO REFILLS for any discharge medications will be authorized once you are discharged, as it is imperative that you return to your primary care physician (or establish a relationship with a  primary care physician if you do not have one) for your aftercare needs so that they can reassess your need for medications and monitor your lab values.     No Known Allergies    Disposition: Home with home health   Consults:  Neurology     Significant Diagnostic Studies:  Dg Abdomen 1 View  Result Date: 03/09/2016 CLINICAL DATA:  46 y/o   M; constipation. EXAM: ABDOMEN - 1 VIEW COMPARISON:  02/07/2015 abdomen radiograph. FINDINGS: The bowel gas pattern is normal. No radio-opaque calculi or other significant radiographic abnormality are seen. Right upper quadrant surgical clips, presumably cholecystectomy. Right pelvic phleboliths. IMPRESSION: Negative. Electronically Signed   By: Kristine Garbe M.D.   On: 03/09/2016 01:27   Ct Head Wo Contrast  Result Date: 03/09/2016 CLINICAL DATA:  Ataxia with increasing gait disturbance for 1 week. Decreased sensation in the hands and feet. EXAM: CT HEAD WITHOUT CONTRAST TECHNIQUE: Contiguous axial images were obtained from the base of the skull through the vertex without intravenous contrast. COMPARISON:  None. FINDINGS: Brain: There vague patchy low-attenuation changes suggested in the deep white matter. This appearance is nonspecific in could represent small vessel ischemic change or other demyelinating/ dysmyelinating process. Consider MS in the appropriate clinical setting. No evidence of acute infarction, hemorrhage, hydrocephalus, extra-axial collection or mass lesion/mass effect. Vascular: No hyperdense vessel or unexpected calcification. Skull: Normal. Negative for fracture or focal lesion. Sinuses/Orbits: Mild mucosal thickening in the paranasal sinuses. No acute air-fluid levels. Mastoid air cells are not opacified. Other: None. IMPRESSION: Suggestion of nonspecific white matter changes. Consider MRI if clinically indicated. No acute intracranial hemorrhage or mass effect. Electronically Signed   By: Lucienne Capers M.D.   On: 03/09/2016 01:44   Mr Jeri Cos And Wo Contrast  Result Date: 03/09/2016 CLINICAL DATA:  Gait ataxia.  Symptoms for 1 week, worsening. EXAM: MRI HEAD WITHOUT AND WITH CONTRAST TECHNIQUE: Multiplanar, multiecho pulse sequences of the brain and surrounding structures were obtained without and with intravenous contrast. CONTRAST:  41m MULTIHANCE GADOBENATE DIMEGLUMINE  529 MG/ML IV SOLN COMPARISON:  None. FINDINGS: Brain: Extensive cerebral white matter disease with the periventricular predominance. Although in places confluent, there is still distinguishable ovoid radiating periventricular lesions. DWI hyperintensity around the occipital horn of the left lateral ventricle is likely shine through. There is hazy restricted diffusion about the atrium of the right lateral ventricle. No infratentorial signal abnormalities noted. There is at least 4 juxta cortical signal abnormalities at the vertex. On the lowest slices, there is visible paired edematous signal in the dorsal columns of the upper cervical cord. No abnormal intracranial enhancement. No acute infarction, hemorrhage, hydrocephalus, extra-axial collection or mass lesion. Vascular: Preserved flow voids Skull and upper cervical spine: Negative Sinuses/Orbits: Patchy mucosal thickening with fluid levels in the right sphenoid sinus lateral recesses. Opacification of left petrous apex air cells without obstructive or expansive changes. Mucous retention cysts in the left maxillary sinus. IMPRESSION: 1. Extensive white matter disease with demyelinating pattern, especially multiple sclerosis. Right periventricular diffusion signal implies active demyelination. 2. Bilateral dorsal column signal abnormality, partly visualized. Recommend cord imaging. Electronically Signed   By: JMonte FantasiaM.D.   On: 03/09/2016 07:07   Mr Cervical Spine W Wo Contrast  Result Date: 03/10/2016 CLINICAL DATA:  Bilateral lower extremity weakness. Abnormal brain MRI a yesterday. EXAM: MRI CERVICAL SPINE WITHOUT AND WITH CONTRAST TECHNIQUE: Multiplanar and multiecho pulse sequences of the cervical spine, to include the craniocervical junction and cervicothoracic junction, were obtained without  and with intravenous contrast. CONTRAST:  31m MULTIHANCE GADOBENATE DIMEGLUMINE 529 MG/ML IV SOLN COMPARISON:  Brain MRI yesterday and thoracic study today.  FINDINGS: Alignment: Normal Vertebrae: No fracture or primary lesion. Cord: Abnormal T2 signal in the dorsal cord beginning at the foramen magnum and extending as low as C7. The appearance could be due to demyelinating disease, but could be also characteristic of subacute combined degeneration. No mass effect or abnormal enhancement. Posterior Fossa, vertebral arteries, paraspinal tissues: Normal Disc levels: Ordinary spondylosis from C3-4 through C7-T1. Mild narrowing of the ventral subarachnoid space but no compressive stenosis. Mild foraminal encroachment by osteophytes. IMPRESSION: Abnormal signal in the dorsal cord from the foramen magnum to C7. Whereas this could be due to demyelinating disease, the appearance is suggestive of subacute combined degeneration. Electronically Signed   By: MNelson ChimesM.D.   On: 03/10/2016 11:56   Mr Thoracic Spine W Wo Contrast  Result Date: 03/10/2016 CLINICAL DATA:  cord lesion questioned on examination yesterday. Decreased sensation in the lower extremities. EXAM: MRI THORACIC SPINE WITH and without CONTRAST CONTRAST:  15 cc MultiHance TECHNIQUE: Multiplanar, multisequence MR imaging of the thoracic spine was performed following the administration of intravenous contrast. COMPARISON:  MRI 03/09/2016 FINDINGS: Alignment:  Normal Vertebrae: No fracture or primary lesion. Cord: Subtle abnormal signal within the dorsal, [place] particularly evident in the region from T7-T10. The appearance is nonspecific and could be secondary to demyelinating disease or subacute combined degeneration. No mass effect. No abnormal enhancement. Paraspinal and other soft tissues: Negative Disc levels: No degenerative disc disease.  No stenosis. IMPRESSION: Abnormal signal within the posterior column of the spinal cord in the region from T7-T10. Findings could be due to demyelinating disease or subacute combined degeneration. Electronically Signed   By: MNelson ChimesM.D.   On: 03/10/2016 11:53        Filed Weights   03/08/16 1826 03/09/16 2200  Weight: 81.6 kg (180 lb) 78.7 kg (173 lb 6.4 oz)      Labs: Results for orders placed or performed during the hospital encounter of 03/09/16 (from the past 48 hour(s))  CBC     Status: Abnormal   Collection Time: 03/14/16  4:49 AM  Result Value Ref Range   WBC 11.0 (H) 4.0 - 10.5 K/uL   RBC 2.98 (L) 4.22 - 5.81 MIL/uL   Hemoglobin 10.7 (L) 13.0 - 17.0 g/dL   HCT 31.9 (L) 39.0 - 52.0 %   MCV 107.0 (H) 78.0 - 100.0 fL   MCH 35.9 (H) 26.0 - 34.0 pg   MCHC 33.5 30.0 - 36.0 g/dL   RDW 12.6 11.5 - 15.5 %   Platelets 88 (L) 150 - 400 K/uL    Comment: REPEATED TO VERIFY PLATELET COUNT CONFIRMED BY SMEAR   Comprehensive metabolic panel     Status: Abnormal   Collection Time: 03/14/16  4:49 AM  Result Value Ref Range   Sodium 138 135 - 145 mmol/L   Potassium 3.4 (L) 3.5 - 5.1 mmol/L   Chloride 102 101 - 111 mmol/L   CO2 26 22 - 32 mmol/L   Glucose, Bld 97 65 - 99 mg/dL   BUN 19 6 - 20 mg/dL   Creatinine, Ser 0.94 0.61 - 1.24 mg/dL   Calcium 8.7 (L) 8.9 - 10.3 mg/dL   Total Protein 6.1 (L) 6.5 - 8.1 g/dL   Albumin 3.3 (L) 3.5 - 5.0 g/dL   AST 18 15 - 41 U/L   ALT 15 (L) 17 -  63 U/L   Alkaline Phosphatase 63 38 - 126 U/L   Total Bilirubin 0.5 0.3 - 1.2 mg/dL   GFR calc non Af Amer >60 >60 mL/min   GFR calc Af Amer >60 >60 mL/min    Comment: (NOTE) The eGFR has been calculated using the CKD EPI equation. This calculation has not been validated in all clinical situations. eGFR's persistently <60 mL/min signify possible Chronic Kidney Disease.    Anion gap 10 5 - 15  Troponin I     Status: None   Collection Time: 03/14/16  4:49 AM  Result Value Ref Range   Troponin I <0.03 <0.03 ng/mL        Lab Results  Component Value Date   CREATININE 0.94 03/14/2016     HPI :    Daniel Morales is an 46 y.o. male Without significant past medical history, presents with multiple complaints, including small amount of blood  only when he wipes, abdominal pain after bowel movement, he reports constipation for a few days, he denies any melena, bright red blood per rectum, no nausea, no vomiting, no coffee-ground emesis, patient does have hemorrhoids on physical exam, hemoglobin is 11.1, baseline 15-16.  patient reports his been having unsteady gait for the last week, reports couple falls recently. MRI was obtained and does show signs of demyelination.   Over the weekend he is noticed that bilateral legs and felt decreased sensation. He seems to notice this has been occurring over the last couple [place] Rile and thought nothing of it. The reason he was brought to the hospital this time was because he felt as though he has lost balance and was having difficulty with gait. With more discussion is also noticed some intermittent symptoms such as tingling in his arms and at times finding difficulty getting his words out. He does not know of any family members that have any autoimmune processes or also sclerosis. Then again he also states that he has never asked most questions specifically. MRI showing extensive white matter disease with left periventricular lesion some DWI hyperintensity suggesting an acute lesion. MRI of the cervical and thoracic spine was also done that showed abnormal signal within the posterior column of the spinal cord, T7-T10,dorsal cord from the foramen magnum to C7, suggestive of subacute combined degeneration   HOSPITAL COURSE:   Ataxic gait MRI evidence of multiple sclerosis vs subacute combined degeneration . - ataxia and unsteady gait over last 1-1/2 week, MRI brain with evidence of findings suggestive of multiple sclerosis with active demyelination vs subacute combined degeneration Neurology consulted, recommended high-dose IV steroids 5 days -Patient has been receiving IV Solu-Medrol 1 g daily x 5 days (started 2/7)-- . They feel that the changes on the MRI are related to vitamin B-12 deficiency. -Protonix  40 twice a day -B12<50- started supplementation with vitamin B-12 injections, will continue to receive B-12 injections, workup for vitamin B-12 deficiency the outpatient setting, MMA levels pending       Macrocytic anemia/thrombocytopenia - Patient reports some blood on the napkins when he wipes, otherwise denies any bright red blood per rectum or melena, suspected to have hemorrhoids, has been recently constipated,  Started on Anusol suppositories and constipation protocol -anusol, now dc as patient is refusing to use it Vitamin B-12<50.  -IM B12- will need outpatient follow up -MMA pending FOBT was positive-recommend outpatient workup for anemia/rectal bleeding May need flexible sigmoidoscopy vs colonoscopy Hemoglobin stable between 10-12 prior to discharge   Chest pain Patient endorses chest pain  on 2/10-2/11 Workup included troponin which was negative 2 EKG within normal limits D-dimer abnormal, CTA PE protocol and venous Doppler completed,both were negative   Discharge Exam:   Blood pressure 133/87, pulse 63, temperature 98.6 F (37 C), temperature source Oral, resp. rate 16, height 5' 10" (1.778 m), weight 78.7 kg (173 lb 6.4 oz), SpO2 99 %.  General exam: Appears calm and comfortable  Respiratory system: Clear to auscultation. Respiratory effort normal. Cardiovascular system: S1 & S2 heard, RRR. No JVD, murmurs, rubs, gallops or clicks. No pedal edema. Gastrointestinal system: Abdomen is nondistended, soft and nontender. No organomegaly or masses felt. Normal bowel sounds heard    Follow-up Information    [facility].   Specialty:  Home Health Services Why:  they will contact you for the first visit Contact information: [address] [place] [address] [phone]        [facility], [place]. Call.   Specialty:  Internal Medicine Why:  To make appointment with PCP Contact information: [place] [address] [phone]           Signed: Reyne Dumas 03/14/2016, 9:30 AM        Time spent >45 mins

## 2016-03-19 LAB — METHYLMALONIC ACID(MMA), RND URINE
Creatinine(Crt), U: 1.2 g/L (ref 0.30–3.00)
MMA - Normalized: 531.1 umol/mmol cr — ABNORMAL HIGH (ref 0.4–2.5)
Methylmalonic Acid, Ur: 5634.2 umol/L — ABNORMAL HIGH (ref 1.6–29.7)

## 2016-04-08 ENCOUNTER — Emergency Department (HOSPITAL_COMMUNITY): Payer: 59

## 2016-04-08 ENCOUNTER — Encounter (HOSPITAL_COMMUNITY): Payer: Self-pay

## 2016-04-08 ENCOUNTER — Inpatient Hospital Stay (HOSPITAL_COMMUNITY)
Admission: EM | Admit: 2016-04-08 | Discharge: 2016-04-11 | DRG: 194 | Disposition: A | Discharge status: Home / Self Care | Payer: 59 | Attending: Internal Medicine | Admitting: Internal Medicine

## 2016-04-08 DIAGNOSIS — Z9049 Acquired absence of other specified parts of digestive tract: Secondary | ICD-10-CM

## 2016-04-08 DIAGNOSIS — E538 Deficiency of other specified B group vitamins: Secondary | ICD-10-CM | POA: Diagnosis present

## 2016-04-08 DIAGNOSIS — J101 Influenza due to other identified influenza virus with other respiratory manifestations: Principal | ICD-10-CM | POA: Diagnosis present

## 2016-04-08 DIAGNOSIS — R9082 White matter disease, unspecified: Secondary | ICD-10-CM | POA: Diagnosis present

## 2016-04-08 DIAGNOSIS — Z87891 Personal history of nicotine dependence: Secondary | ICD-10-CM

## 2016-04-08 DIAGNOSIS — G320 Subacute combined degeneration of spinal cord in diseases classified elsewhere: Secondary | ICD-10-CM | POA: Diagnosis present

## 2016-04-08 DIAGNOSIS — G379 Demyelinating disease of central nervous system, unspecified: Secondary | ICD-10-CM | POA: Diagnosis present

## 2016-04-08 DIAGNOSIS — D72819 Decreased white blood cell count, unspecified: Secondary | ICD-10-CM | POA: Diagnosis present

## 2016-04-08 DIAGNOSIS — M5104 Intervertebral disc disorders with myelopathy, thoracic region: Secondary | ICD-10-CM | POA: Diagnosis present

## 2016-04-08 DIAGNOSIS — D696 Thrombocytopenia, unspecified: Secondary | ICD-10-CM | POA: Diagnosis present

## 2016-04-08 DIAGNOSIS — K219 Gastro-esophageal reflux disease without esophagitis: Secondary | ICD-10-CM | POA: Diagnosis present

## 2016-04-08 DIAGNOSIS — Z833 Family history of diabetes mellitus: Secondary | ICD-10-CM

## 2016-04-08 DIAGNOSIS — Z79899 Other long term (current) drug therapy: Secondary | ICD-10-CM

## 2016-04-08 DIAGNOSIS — R2681 Unsteadiness on feet: Secondary | ICD-10-CM

## 2016-04-08 DIAGNOSIS — Z8249 Family history of ischemic heart disease and other diseases of the circulatory system: Secondary | ICD-10-CM

## 2016-04-08 DIAGNOSIS — R531 Weakness: Secondary | ICD-10-CM

## 2016-04-08 LAB — URINALYSIS, ROUTINE W REFLEX MICROSCOPIC
BACTERIA UA: NONE SEEN
Bilirubin Urine: NEGATIVE
Glucose, UA: NEGATIVE mg/dL
HGB URINE DIPSTICK: NEGATIVE
Ketones, ur: 5 mg/dL — AB
Leukocytes, UA: NEGATIVE
NITRITE: NEGATIVE
Protein, ur: 30 mg/dL — AB
SPECIFIC GRAVITY, URINE: 1.027 (ref 1.005–1.030)
SQUAMOUS EPITHELIAL / LPF: NONE SEEN
pH: 5 (ref 5.0–8.0)

## 2016-04-08 LAB — VITAMIN B12: VITAMIN B 12: 670 pg/mL (ref 180–914)

## 2016-04-08 LAB — COMPREHENSIVE METABOLIC PANEL
ALK PHOS: 93 U/L (ref 38–126)
ALT: 17 U/L (ref 17–63)
ANION GAP: 9 (ref 5–15)
AST: 22 U/L (ref 15–41)
Albumin: 3.5 g/dL (ref 3.5–5.0)
BILIRUBIN TOTAL: 0.5 mg/dL (ref 0.3–1.2)
BUN: 8 mg/dL (ref 6–20)
CALCIUM: 9.1 mg/dL (ref 8.9–10.3)
CO2: 25 mmol/L (ref 22–32)
CREATININE: 1.09 mg/dL (ref 0.61–1.24)
Chloride: 104 mmol/L (ref 101–111)
Glucose, Bld: 89 mg/dL (ref 65–99)
Potassium: 3.4 mmol/L — ABNORMAL LOW (ref 3.5–5.1)
Sodium: 138 mmol/L (ref 135–145)
TOTAL PROTEIN: 7.2 g/dL (ref 6.5–8.1)

## 2016-04-08 LAB — CBC WITH DIFFERENTIAL/PLATELET
BASOS ABS: 0 10*3/uL (ref 0.0–0.1)
BASOS PCT: 1 %
EOS ABS: 0 10*3/uL (ref 0.0–0.7)
Eosinophils Relative: 1 %
HCT: 39.3 % (ref 39.0–52.0)
HEMOGLOBIN: 13 g/dL (ref 13.0–17.0)
Lymphocytes Relative: 17 %
Lymphs Abs: 0.6 10*3/uL — ABNORMAL LOW (ref 0.7–4.0)
MCH: 32.8 pg (ref 26.0–34.0)
MCHC: 33.1 g/dL (ref 30.0–36.0)
MCV: 99.2 fL (ref 78.0–100.0)
MONOS PCT: 21 %
Monocytes Absolute: 0.7 10*3/uL (ref 0.1–1.0)
NEUTROS PCT: 62 %
Neutro Abs: 2.1 10*3/uL (ref 1.7–7.7)
Platelets: 146 10*3/uL — ABNORMAL LOW (ref 150–400)
RBC: 3.96 MIL/uL — ABNORMAL LOW (ref 4.22–5.81)
RDW: 16.8 % — ABNORMAL HIGH (ref 11.5–15.5)
WBC: 3.4 10*3/uL — AB (ref 4.0–10.5)

## 2016-04-08 LAB — SEDIMENTATION RATE: Sed Rate: 20 mm/hr — ABNORMAL HIGH (ref 0–16)

## 2016-04-08 LAB — I-STAT CG4 LACTIC ACID, ED: LACTIC ACID, VENOUS: 1.17 mmol/L (ref 0.5–1.9)

## 2016-04-08 LAB — I-STAT TROPONIN, ED: TROPONIN I, POC: 0 ng/mL (ref 0.00–0.08)

## 2016-04-08 LAB — INFLUENZA PANEL BY PCR (TYPE A & B)
INFLAPCR: NEGATIVE
INFLBPCR: POSITIVE — AB

## 2016-04-08 NOTE — ED Notes (Signed)
EMS gave 1000mg  of Tylenol in route

## 2016-04-08 NOTE — ED Triage Notes (Signed)
Pt arrived via [facility] c/o non productive cough and fever the last 3 days.

## 2016-04-08 NOTE — ED Provider Notes (Signed)
[facility]-EMERGENCY DEPT Provider Note   CSN: [id number] Arrival date & time: 04/08/16  1725     History   Chief Complaint Chief Complaint  Patient presents with  . Cough  . Fever    HPI Daniel Morales is a 46 y.o. male.  HPI The patient's medical history now includes a demyelinating disorder. Patient was hospitalized in the beginning of February with severe weakness. MRI showed significant white matter disease with demyelinating pattern. During his evaluation and hospitalization consideration was given for MS versus B-12 deficiency. He has been getting routine B-12 supplementation and is due for a shot tomorrow. After discharge from the hospital he had improved and his strength had improved. He had not gotten back to baseline but was better. He reports yesterday evening he had an episode of getting fairly weak and being limited in his physical capacity. He however went to sleep and when he awoke in the morning starting seemed better. Today however weakness became progressively severe to the point that he was unable to get himself out of his bed independently. He reports he has had some very mild cold-like symptoms for a couple of days. He reports he had a low-grade fever and association with this. He denies that he has had difficulty breathing or chest pain. He denies he's had difficulty swallowing or managing secretions. Past Medical History:  Diagnosis Date  . Constipation   . Environmental allergies    year round  . GERD (gastroesophageal reflux disease)    "heartburn at times"  . Headache(784.0)   . Shortness of breath    "bc of my allergies"    Patient Active Problem List   Diagnosis Date Noted  . Demyelinating disease (HCC)   . Anemia due to vitamin B12 deficiency   . 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 Multiple sclerosis (HCC) 03/09/2016  . Constipation 03/09/2016  . S/P laparoscopic cholecystectomy & ERCP March 2013 05/06/2011  . Symptomatic cholelithiasis 04/20/2011  .  Choledocholithiasis 04/20/2011    Past Surgical History:  Procedure Laterality Date  . CHOLECYSTECTOMY  04/21/2011   Procedure: LAPAROSCOPIC CHOLECYSTECTOMY WITH INTRAOPERATIVE CHOLANGIOGRAM;  Surgeon: Valarie Merino, MD;  Location: [facility];  Service: General;  Laterality: N/A;  . CIRCUMCISION    . ERCP  04/20/2011   Procedure: ENDOSCOPIC RETROGRADE CHOLANGIOPANCREATOGRAPHY (ERCP);  Surgeon: Petra Kuba, MD;  Location: [facility];  Service: Endoscopy;  Laterality: N/A;       Home Medications    Prior to Admission medications   Medication Sig Start Date End Date Taking? Authorizing Provider  cyanocobalamin (,VITAMIN B-12,) 1000 MCG/ML injection Intramuscular daily for another 3 days, then once a week 4 weeks, then once a month Patient taking differently: Inject 1,000 mcg into the muscle every 7 (seven) days. On Fridays 03/14/16  Yes Richarda Overlie, MD  Dextromethorphan HBr (VICKS DAYQUIL COUGH PO) Take 1 tablet by mouth as needed (for cough).   Yes Historical Provider, MD  senna-docusate (SENOKOT-S) 8.6-50 MG tablet Take 2 tablets by mouth 2 (two) times daily. 03/14/16 04/13/16 Yes Richarda Overlie, MD  cyclobenzaprine (FLEXERIL) 10 MG tablet Take 1 tablet (10 mg total) by mouth 3 (three) times daily as needed for muscle spasms. Patient not taking: Reported on 04/08/2016 03/14/16   Richarda Overlie, MD  pantoprazole (PROTONIX) 40 MG tablet Take 1 tablet (40 mg total) by mouth daily. Patient not taking: Reported on 04/08/2016 03/14/16 04/13/16  Richarda Overlie, MD    Family History Family History  Problem Relation Age of Onset  .  Hypertension Other   . Diabetes Other     Social History Social History  Substance Use Topics  . Smoking status: Former Games developer  . Smokeless tobacco: Never Used  . Alcohol use No     Allergies   Patient has no known allergies.   Review of Systems Review of Systems 10 Systems reviewed and are negative for acute change except as noted in the HPI.   Physical  Exam Updated Vital Signs BP 132/94   Pulse 101   Temp 100.1 F (37.8 C) (Oral)   Resp 16   Ht 5\' 10"  (1.778 m)   Wt 173 lb (78.5 kg)   SpO2 99%   BMI 24.82 kg/m   Physical Exam  Constitutional: He is oriented to person, place, and time. He appears well-developed and well-nourished. No distress.  HENT:  Head: Normocephalic and atraumatic.  Nose: Nose normal.  Mouth/Throat: Oropharynx is clear and moist.  Eyes: Conjunctivae and EOM are normal. Pupils are equal, round, and reactive to light.  Neck: Neck supple.  Cardiovascular: Normal rate and regular rhythm.   No murmur heard. Pulmonary/Chest: Effort normal and breath sounds normal. No respiratory distress.  Abdominal: Soft. There is no tenderness.  Musculoskeletal: Normal range of motion. He exhibits no edema or tenderness.  Neurological: He is alert and oriented to person, place, and time. No cranial nerve deficit. He exhibits normal muscle tone.  Skin: Skin is warm and dry.  Psychiatric: He has a normal mood and affect.  Nursing note and vitals reviewed.    ED Treatments / Results  Labs (all labs ordered are listed, but only abnormal results are displayed) Labs Reviewed  COMPREHENSIVE METABOLIC PANEL - Abnormal; Notable for the following:       Result Value   Potassium 3.4 (*)    All other components within normal limits  CBC WITH DIFFERENTIAL/PLATELET - Abnormal; Notable for the following:    WBC 3.4 (*)    RBC 3.96 (*)    RDW 16.8 (*)    Platelets 146 (*)    Lymphs Abs 0.6 (*)    All other components within normal limits  URINALYSIS, ROUTINE W REFLEX MICROSCOPIC - Abnormal; Notable for the following:    Ketones, ur 5 (*)    Protein, ur 30 (*)    All other components within normal limits  INFLUENZA PANEL BY PCR (TYPE A & B) - Abnormal; Notable for the following:    Influenza B By PCR POSITIVE (*)    All other components within normal limits  SEDIMENTATION RATE - Abnormal; Notable for the following:    Sed  Rate 20 (*)    All other components within normal limits  CULTURE, BLOOD (ROUTINE X 2)  CULTURE, BLOOD (ROUTINE X 2)  VITAMIN B12  I-STAT CG4 LACTIC ACID, ED  I-STAT TROPOININ, ED    EKG  EKG Interpretation None       Radiology Dg Chest 2 View  Result Date: 04/08/2016 CLINICAL DATA:  Weakness fatigue and cough EXAM: CHEST  2 VIEW COMPARISON:  03/14/2016 FINDINGS: The heart size and mediastinal contours are within normal limits. Both lungs are clear. The visualized skeletal structures are unremarkable. Surgical clips in the right upper quadrant. IMPRESSION: No active cardiopulmonary disease. Electronically Signed   By: Jasmine Pang M.D.   On: 04/08/2016 19:03    Procedures Procedures (including critical care time)  Medications Ordered in ED Medications - No data to display   Initial Impression / Assessment and Plan / ED  Course  I have reviewed the triage vital signs and the nursing notes.  Pertinent labs & imaging results that were available during my care of the patient were reviewed by me and considered in my medical decision making (see chart for details).     Consult: Neurology Dr. Loralee Pacas to see patient in the emergency department.  Final Clinical Impressions(s) / ED Diagnoses   Final diagnoses:  Generalized weakness  Demyelinating disease of central nervous system [facility])  Gait instability  Patient with complicated neurologic weakness. Differential diagnosis is still including MS versus B-12 deficiency. At this time, I have higher suspicion for MS with the acute decompensation that the patient underwent in less than the past 24 hours. Patient had what sounds like a viral URI which may have precipitated this. Patient does not have any respiratory compromise or difficulty protecting his airway. Awaiting neurology consultation for determination on further diagnostic evaluation versus initiating treatment.  New Prescriptions New Prescriptions   No medications on file      Arby Barrette, MD 04/09/16 [phone]

## 2016-04-09 ENCOUNTER — Inpatient Hospital Stay (HOSPITAL_COMMUNITY): Payer: 59

## 2016-04-09 DIAGNOSIS — Z87891 Personal history of nicotine dependence: Secondary | ICD-10-CM | POA: Diagnosis not present

## 2016-04-09 DIAGNOSIS — Z9049 Acquired absence of other specified parts of digestive tract: Secondary | ICD-10-CM | POA: Diagnosis not present

## 2016-04-09 DIAGNOSIS — J101 Influenza due to other identified influenza virus with other respiratory manifestations: Secondary | ICD-10-CM | POA: Diagnosis present

## 2016-04-09 DIAGNOSIS — Z79899 Other long term (current) drug therapy: Secondary | ICD-10-CM | POA: Diagnosis not present

## 2016-04-09 DIAGNOSIS — D72819 Decreased white blood cell count, unspecified: Secondary | ICD-10-CM | POA: Diagnosis present

## 2016-04-09 DIAGNOSIS — G320 Subacute combined degeneration of spinal cord in diseases classified elsewhere: Secondary | ICD-10-CM | POA: Diagnosis present

## 2016-04-09 DIAGNOSIS — K219 Gastro-esophageal reflux disease without esophagitis: Secondary | ICD-10-CM | POA: Diagnosis present

## 2016-04-09 DIAGNOSIS — R9082 White matter disease, unspecified: Secondary | ICD-10-CM | POA: Diagnosis present

## 2016-04-09 DIAGNOSIS — M5104 Intervertebral disc disorders with myelopathy, thoracic region: Secondary | ICD-10-CM | POA: Diagnosis present

## 2016-04-09 DIAGNOSIS — E538 Deficiency of other specified B group vitamins: Secondary | ICD-10-CM | POA: Diagnosis present

## 2016-04-09 DIAGNOSIS — Z833 Family history of diabetes mellitus: Secondary | ICD-10-CM | POA: Diagnosis not present

## 2016-04-09 DIAGNOSIS — G379 Demyelinating disease of central nervous system, unspecified: Secondary | ICD-10-CM | POA: Diagnosis present

## 2016-04-09 DIAGNOSIS — G959 Disease of spinal cord, unspecified: Secondary | ICD-10-CM | POA: Diagnosis not present

## 2016-04-09 DIAGNOSIS — D696 Thrombocytopenia, unspecified: Secondary | ICD-10-CM | POA: Diagnosis present

## 2016-04-09 DIAGNOSIS — Z8249 Family history of ischemic heart disease and other diseases of the circulatory system: Secondary | ICD-10-CM | POA: Diagnosis not present

## 2016-04-09 LAB — CBC
HCT: 41 % (ref 39.0–52.0)
Hemoglobin: 13.6 g/dL (ref 13.0–17.0)
MCH: 32.9 pg (ref 26.0–34.0)
MCHC: 33.2 g/dL (ref 30.0–36.0)
MCV: 99 fL (ref 78.0–100.0)
PLATELETS: 148 10*3/uL — AB (ref 150–400)
RBC: 4.14 MIL/uL — AB (ref 4.22–5.81)
RDW: 16.6 % — AB (ref 11.5–15.5)
WBC: 3.2 10*3/uL — AB (ref 4.0–10.5)

## 2016-04-09 LAB — MAGNESIUM: Magnesium: 1.7 mg/dL (ref 1.7–2.4)

## 2016-04-09 LAB — BASIC METABOLIC PANEL
ANION GAP: 8 (ref 5–15)
BUN: 9 mg/dL (ref 6–20)
CALCIUM: 9.2 mg/dL (ref 8.9–10.3)
CO2: 26 mmol/L (ref 22–32)
Chloride: 103 mmol/L (ref 101–111)
Creatinine, Ser: 1.07 mg/dL (ref 0.61–1.24)
Glucose, Bld: 88 mg/dL (ref 65–99)
Potassium: 3.8 mmol/L (ref 3.5–5.1)
SODIUM: 137 mmol/L (ref 135–145)

## 2016-04-09 MED ORDER — SENNOSIDES-DOCUSATE SODIUM 8.6-50 MG PO TABS
2.0000 | ORAL_TABLET | Freq: Two times a day (BID) | ORAL | Status: DC
  Administered 2016-04-09 – 2016-04-11 (×4): 2 via ORAL
  Filled 2016-04-09 (×6): qty 2

## 2016-04-09 MED ORDER — INFLUENZA VAC SPLIT QUAD 0.5 ML IM SUSY
0.5000 mL | PREFILLED_SYRINGE | INTRAMUSCULAR | Status: DC
  Filled 2016-04-09: qty 0.5

## 2016-04-09 MED ORDER — ACETAMINOPHEN 500 MG PO TABS
1000.0000 mg | ORAL_TABLET | Freq: Four times a day (QID) | ORAL | Status: DC | PRN
  Administered 2016-04-09 – 2016-04-11 (×3): 1000 mg via ORAL
  Filled 2016-04-09 (×3): qty 2

## 2016-04-09 MED ORDER — GADOBENATE DIMEGLUMINE 529 MG/ML IV SOLN
15.0000 mL | Freq: Once | INTRAVENOUS | Status: AC | PRN
  Administered 2016-04-09: 15 mL via INTRAVENOUS

## 2016-04-09 MED ORDER — ENSURE ENLIVE PO LIQD: 237.0000 mL | Freq: Two times a day (BID) | ORAL | Status: DC

## 2016-04-09 MED ORDER — OSELTAMIVIR PHOSPHATE 75 MG PO CAPS
75.0000 mg | ORAL_CAPSULE | Freq: Two times a day (BID) | ORAL | Status: DC
  Filled 2016-04-09: qty 1

## 2016-04-09 MED ORDER — HEPARIN SODIUM (PORCINE) 5000 UNIT/ML IJ SOLN
5000.0000 [IU] | Freq: Three times a day (TID) | INTRAMUSCULAR | Status: DC
  Administered 2016-04-09 – 2016-04-11 (×7): 5000 [IU] via SUBCUTANEOUS
  Filled 2016-04-09 (×7): qty 1

## 2016-04-09 MED ORDER — CYANOCOBALAMIN 1000 MCG/ML IJ SOLN
1000.0000 ug | INTRAMUSCULAR | Status: DC
  Administered 2016-04-09: 1000 ug via INTRAMUSCULAR
  Filled 2016-04-09: qty 1

## 2016-04-09 MED ORDER — OSELTAMIVIR PHOSPHATE 75 MG PO CAPS
75.0000 mg | ORAL_CAPSULE | Freq: Two times a day (BID) | ORAL | Status: DC
  Administered 2016-04-09 – 2016-04-11 (×5): 75 mg via ORAL
  Filled 2016-04-09 (×5): qty 1

## 2016-04-09 MED ORDER — ACETAMINOPHEN 500 MG PO TABS
1000.0000 mg | ORAL_TABLET | Freq: Once | ORAL | Status: AC
  Administered 2016-04-09: 1000 mg via ORAL
  Filled 2016-04-09: qty 2

## 2016-04-09 NOTE — H&P (Signed)
History and Physical    Daniel Morales WJX:[id number] DOB: [birth year] DOA: 04/08/2016   PCP: Georgianne Fick, MD Chief Complaint:  Chief Complaint  Patient presents with  . Cough  . Fever    HPI: Daniel Morales is a 46 y.o. male with medical history significant of demyelinating disorder (MS vs NMO, vs B12 deficiency).  Patient was hospitalized at beginning of Feb with severe weakness.  MRI showed significant white matter disease with demyelinating pattern.  Routine B12 supplementation started.  After discharge from hospital his symptoms had been gradually improving until yesterday evening when he had onset of worsening weakness in all 4 extremities.  Today he has fever, chills, cough, and URI symptoms as well.  Patient presents to ED for worsening symptoms.  ED Course: B12 WNL, Influenza B is positive.  Review of Systems: As per HPI otherwise 10 point review of systems negative.    Past Medical History:  Diagnosis Date  . Constipation   . Environmental allergies    year round  . GERD (gastroesophageal reflux disease)    "heartburn at times"  . Headache(784.0)   . Shortness of breath    "bc of my allergies"    Past Surgical History:  Procedure Laterality Date  . CHOLECYSTECTOMY  04/21/2011   Procedure: LAPAROSCOPIC CHOLECYSTECTOMY WITH INTRAOPERATIVE CHOLANGIOGRAM;  Surgeon: Valarie Merino, MD;  Location: [facility];  Service: General;  Laterality: N/A;  . CIRCUMCISION    . ERCP  04/20/2011   Procedure: ENDOSCOPIC RETROGRADE CHOLANGIOPANCREATOGRAPHY (ERCP);  Surgeon: Petra Kuba, MD;  Location: [facility];  Service: Endoscopy;  Laterality: N/A;     reports that he has quit smoking. He has never used smokeless tobacco. He reports that he does not drink alcohol or use drugs.  No Known Allergies  Family History  Problem Relation Age of Onset  . Hypertension Other   . Diabetes Other       Prior to Admission medications   Medication Sig Start Date End Date  Taking? Authorizing Provider  cyanocobalamin (,VITAMIN B-12,) 1000 MCG/ML injection Intramuscular daily for another 3 days, then once a week 4 weeks, then once a month Patient taking differently: Inject 1,000 mcg into the muscle every 7 (seven) days. On Fridays 03/14/16  Yes Richarda Overlie, MD  Dextromethorphan HBr (VICKS DAYQUIL COUGH PO) Take 1 tablet by mouth as needed (for cough).   Yes Historical Provider, MD  senna-docusate (SENOKOT-S) 8.6-50 MG tablet Take 2 tablets by mouth 2 (two) times daily. 03/14/16 04/13/16 Yes Richarda Overlie, MD    Physical Exam: Vitals:   04/09/16 0030 04/09/16 0045 04/09/16 0100 04/09/16 0115  BP: 130/99 127/98 130/94 133/96  Pulse: 105 105 103 106  Resp: 15 19 12 18   Temp:      TempSrc:      SpO2: 96% 97% 96% 96%  Weight:      Height:          Constitutional: NAD, calm, comfortable Eyes: PERRL, lids and conjunctivae normal ENMT: Mucous membranes are moist. Posterior pharynx clear of any exudate or lesions.Normal dentition.  Neck: normal, supple, no masses, no thyromegaly Respiratory: clear to auscultation bilaterally, no wheezing, no crackles. Normal respiratory effort. No accessory muscle use.  Cardiovascular: Regular rate and rhythm, no murmurs / rubs / gallops. No extremity edema. 2+ pedal pulses. No carotid bruits.  Abdomen: no tenderness, no masses palpated. No hepatosplenomegaly. Bowel sounds positive.  Musculoskeletal: no clubbing / cyanosis. No joint deformity upper and lower extremities. Good ROM, no contractures. Normal muscle  tone.  Skin: no rashes, lesions, ulcers. No induration Neurologic: CN 2-12 grossly intact. Sensation intact, DTR normal. Strength 5/5 in all 4.  Psychiatric: Normal judgment and insight. Alert and oriented x 3. Normal mood.    Labs on Admission: I have personally reviewed following labs and imaging studies  CBC:  Recent Labs Lab 04/08/16 1912  WBC 3.4*  NEUTROABS 2.1  HGB 13.0  HCT 39.3  MCV 99.2  PLT 146*    Basic Metabolic Panel:  Recent Labs Lab 04/08/16 1912  NA 138  K 3.4*  CL 104  CO2 25  GLUCOSE 89  BUN 8  CREATININE 1.09  CALCIUM 9.1   GFR: Estimated Creatinine Clearance: 87.4 mL/min (by C-G formula based on SCr of 1.09 mg/dL). Liver Function Tests:  Recent Labs Lab 04/08/16 1912  AST 22  ALT 17  ALKPHOS 93  BILITOT 0.5  PROT 7.2  ALBUMIN 3.5   No results for input(s): LIPASE, AMYLASE in the last 168 hours. No results for input(s): AMMONIA in the last 168 hours. Coagulation Profile: No results for input(s): INR, PROTIME in the last 168 hours. Cardiac Enzymes: No results for input(s): CKTOTAL, CKMB, CKMBINDEX, TROPONINI in the last 168 hours. BNP (last 3 results) No results for input(s): PROBNP in the last 8760 hours. HbA1C: No results for input(s): HGBA1C in the last 72 hours. CBG: No results for input(s): GLUCAP in the last 168 hours. Lipid Profile: No results for input(s): CHOL, HDL, LDLCALC, TRIG, CHOLHDL, LDLDIRECT in the last 72 hours. Thyroid Function Tests: No results for input(s): TSH, T4TOTAL, FREET4, T3FREE, THYROIDAB in the last 72 hours. Anemia Panel:  Recent Labs  04/08/16 1912  VITAMINB12 670   Urine analysis:    Component Value Date/Time   COLORURINE YELLOW 04/08/2016 2146   APPEARANCEUR CLEAR 04/08/2016 2146   LABSPEC 1.027 04/08/2016 2146   PHURINE 5.0 04/08/2016 2146   GLUCOSEU NEGATIVE 04/08/2016 2146   HGBUR NEGATIVE 04/08/2016 2146   BILIRUBINUR NEGATIVE 04/08/2016 2146   KETONESUR 5 (A) 04/08/2016 2146   PROTEINUR 30 (A) 04/08/2016 2146   UROBILINOGEN 4.0 (H) 04/19/2011 2207   NITRITE NEGATIVE 04/08/2016 2146   LEUKOCYTESUR NEGATIVE 04/08/2016 2146   Sepsis Labs: @LABRCNTIP (procalcitonin:4,lacticidven:4) )No results found for this or any previous visit (from the past 240 hour(s)).   Radiological Exams on Admission: Dg Chest 2 View  Result Date: 04/08/2016 CLINICAL DATA:  Weakness fatigue and cough EXAM: CHEST  2  VIEW COMPARISON:  03/14/2016 FINDINGS: The heart size and mediastinal contours are within normal limits. Both lungs are clear. The visualized skeletal structures are unremarkable. Surgical clips in the right upper quadrant. IMPRESSION: No active cardiopulmonary disease. Electronically Signed   By: Jasmine Pang M.D.   On: 04/08/2016 19:03    EKG: Independently reviewed.  Assessment/Plan Principal Problem:   Demyelinating disease (HCC) Active Problems:   Influenza B    1. Demyelinating disease - 1. DDx = MS vs NMO vs transient worsening of neurologic status with pre-existing B12 neuropathy 1. Probably the last of the 3, see neurology note for details 2. B12 WNL 3. MRI C and T spine 2. Influenza B - 1. Tamiflu 2. Tylenol PRN fever   DVT prophylaxis: Heparin [place] Code Status: Full Family Communication: Family at bedside Consults called: Neuro Admission status: Admit to inpatient   Hillary Bow DO [facility] Pager [phone] from 7PM-7AM  If 7AM-7PM, please contact the day physician for the patient [email or web address] Password [email or web address]  04/09/2016, 1:50 AM

## 2016-04-09 NOTE — ED Notes (Signed)
Delay in lab draw,  Pt in MRI. 

## 2016-04-09 NOTE — ED Notes (Signed)
Pt in MRI.

## 2016-04-09 NOTE — ED Notes (Signed)
Patient transported to MRI 

## 2016-04-09 NOTE — Progress Notes (Signed)
Patient seen and examined, admitted by Dr. Julian Reil this morning Briefly 46 year old male with demyelinating  disorder,(MS vs NMO, vs B12 deficiency).  Patient was hospitalized at beginning of Feb with severe weakness.  MRI showed significant white matter disease with demyelinating pattern.  Routine B12 supplementation started, admitted with fever, chills, cough and URI symptoms and worsening of his weakness. Found to have influenza B.  BP 113/79 (BP Location: Left Arm)   Pulse 98   Temp 99.5 F (37.5 C) (Oral)   Resp 18   Ht 5' 10.5" (1.791 m)   Wt 77.7 kg (171 lb 4.8 oz)   SpO2 98%   BMI 24.23 kg/m    Agree with A/P Influenza B - Continue Tamiflu  Weakness/B12 myelopathy - Neurology recommendations appreciated, B12 replacement today - PTOT evaluation - MRI of the C-spine and thoracic spine with no evidence of active demyelination   RAI,RIPUDEEP M.D. [facility] 04/09/2016, 10:13 AM  Pager: [phone]

## 2016-04-09 NOTE — Consult Note (Signed)
NEURO HOSPITALIST CONSULT NOTE   Requestig physician: Dr. Donnald Garre  Reason for Consult: Worsening upper and lower extremity weakness in the context of new febrile illness  History obtained from:   Patient and Chart     HPI:                                                                                                                                          Daniel Morales is an 46 y.o. male who presents with subacute worsening of upper and lower extremity weakness. The weakness has made it difficult to walk. The patient states that the weakness is symmetric. He also complains of an uncomfortable zinging and tingling sensation that travels down his neck and spine when he flexes his neck. He was recently diagnosed with B12 myelopathy.  Past Medical History:  Diagnosis Date  . Constipation   . Environmental allergies    year round  . GERD (gastroesophageal reflux disease)    "heartburn at times"  . Headache(784.0)   . Shortness of breath    "bc of my allergies"    Past Surgical History:  Procedure Laterality Date  . CHOLECYSTECTOMY  04/21/2011   Procedure: LAPAROSCOPIC CHOLECYSTECTOMY WITH INTRAOPERATIVE CHOLANGIOGRAM;  Surgeon: Valarie Merino, MD;  Location: [facility];  Service: General;  Laterality: N/A;  . CIRCUMCISION    . ERCP  04/20/2011   Procedure: ENDOSCOPIC RETROGRADE CHOLANGIOPANCREATOGRAPHY (ERCP);  Surgeon: Petra Kuba, MD;  Location: [facility];  Service: Endoscopy;  Laterality: N/A;    Family History  Problem Relation Age of Onset  . Hypertension Other   . Diabetes Other    Social History:  reports that he has quit smoking. He has never used smokeless tobacco. He reports that he does not drink alcohol or use drugs.  No Known Allergies  MEDICATIONS:                                                                                                                     Prior to Admission:  Prescriptions Prior to Admission  Medication Sig  Dispense Refill Last Dose  . cyanocobalamin (,VITAMIN B-12,) 1000 MCG/ML injection Intramuscular daily for another 3 days, then once a week 4 weeks, then once a month (Patient taking differently: Inject 1,000 mcg into the  muscle every 7 (seven) days. On Fridays) 1 mL 30 04/02/2016 at Unknown time  . Dextromethorphan HBr (VICKS DAYQUIL COUGH PO) Take 1 tablet by mouth as needed (for cough).   04/08/2016 at Unknown time  . senna-docusate (SENOKOT-S) 8.6-50 MG tablet Take 2 tablets by mouth 2 (two) times daily. 120 tablet 2 04/08/2016 at Unknown time   Scheduled: . cyanocobalamin  1,000 mcg Intramuscular Q Fri  . feeding supplement (ENSURE ENLIVE)  237 mL Oral BID BM  . heparin  5,000 Units Subcutaneous Q8H  . [START ON 04/10/2016] Influenza vac split quadrivalent PF  0.5 mL Intramuscular Tomorrow-1000  . oseltamivir  75 mg Oral BID  . senna-docusate  2 tablet Oral BID   ROS:                                                                                                                                       History obtained from patient. Positive for cough, fever and chills. Other ROS as per HPI.   Blood pressure 132/94, pulse 101, temperature 100.1 F (37.8 C), temperature source Oral, resp. rate 16, height 5\' 10"  (1.778 m), weight 78.5 kg (173 lb), SpO2 99 %.  General Examination:                                                                                                      General: NAD HEENT-  Normocephalic/atraumatic. Skin: Pink rash in butterfly distribution symmetrically on examination of face Extremities- No edema.   Neurological Examination Mental Status: Alert, oriented, thought content appropriate.  Speech fluent without evidence of aphasia.  Able to follow all commands without difficulty. Cranial Nerves: II: Visual fields intact, PERRL III,IV, VI: ptosis not present, EOMI without nystagmus V,VII: smile symmetric, facial temperature sensation normal bilaterally VIII: hearing  intact to questions and commands IX,X: No hypophonia XI: No asymmetry XII: midline tongue extension Motor: Right : Upper extremity   4+/5    Left:     Upper extremity   4+/5  Lower extremity   4+/5    Lower extremity   4+/5 Increased tone to bilateral lower extremities (spastic) Sensory: Temperature and light touch intact in all 4 extremities without extinction. Severely impaired proprioception to bilateral toes.  Deep Tendon Reflexes: 3+ bilateral upper extremities. 4+ patellae, 2+ achilles. Toes downgoing bilaterally. Cerebellar: No ataxia with FNF bilaterally.  Gait: Deferred due to falls risk concerns  Lab Results: Basic Metabolic Panel:  Recent Labs Lab 04/08/16 1912  NA 138  K 3.4*  CL 104  CO2 25  GLUCOSE 89  BUN 8  CREATININE 1.09  CALCIUM 9.1    Liver Function Tests:  Recent Labs Lab 04/08/16 1912  AST 22  ALT 17  ALKPHOS 93  BILITOT 0.5  PROT 7.2  ALBUMIN 3.5   No results for input(s): LIPASE, AMYLASE in the last 168 hours. No results for input(s): AMMONIA in the last 168 hours.  CBC:  Recent Labs Lab 04/08/16 1912  WBC 3.4*  NEUTROABS 2.1  HGB 13.0  HCT 39.3  MCV 99.2  PLT 146*    Cardiac Enzymes: No results for input(s): CKTOTAL, CKMB, CKMBINDEX, TROPONINI in the last 168 hours.  Lipid Panel: No results for input(s): CHOL, TRIG, HDL, CHOLHDL, VLDL, LDLCALC in the last 168 hours.  CBG: No results for input(s): GLUCAP in the last 168 hours.  Microbiology: No results found for this or any previous visit.  Coagulation Studies: No results for input(s): LABPROT, INR in the last 72 hours.  Imaging: Dg Chest 2 View  Result Date: 04/08/2016 CLINICAL DATA:  Weakness fatigue and cough EXAM: CHEST  2 VIEW COMPARISON:  03/14/2016 FINDINGS: The heart size and mediastinal contours are within normal limits. Both lungs are clear. The visualized skeletal structures are unremarkable. Surgical clips in the right upper quadrant. IMPRESSION: No active  cardiopulmonary disease. Electronically Signed   By: Jasmine Pang M.D.   On: 04/08/2016 19:03   Assessment: 1. Most likely components of the DDx are multiple sclerosis, neuromyelitis optica and preexisting lesion from B12 myelopathy resulting in transient neurological worsening secondary to fever/intercurrent infection (Uhtoff's phenomenon).  2. Influenza B positive by PCR. 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  3. Of note, MMA was markedly elevated on 2/8, which further increases specificity for the previously made diagnosis of B12 myelopathy. Also consistent with this is severely impaired proprioception bilateral toes. The spastic weakness noted on exam is also compatible with this diagnosis.   Recommendations: 1. MRI of cervical and thoracic spine with and without contrast.  2. Management of fever.  3. Serum NMO antibody titer.  4. PT/OT.  04/09/2016, 12:05 AM

## 2016-04-09 NOTE — ED Notes (Signed)
Report attempted 

## 2016-04-09 NOTE — ED Notes (Signed)
Delay in lab draw,  Pt still in MRI. 

## 2016-04-09 NOTE — Progress Notes (Signed)
Nutrition Brief Note  Patient identified on the Malnutrition Screening Tool (MST) Report  Wt Readings from Last 15 Encounters:  04/09/16 171 lb 4.8 oz (77.7 kg)  03/09/16 173 lb 6.4 oz (78.7 kg)  05/06/11 172 lb (78 kg)  04/20/11 165 lb 11.2 oz (75.2 kg)   Daniel Morales is a 46 y.o. male with medical history significant of demyelinating disorder (MS vs NMO, vs B12 deficiency).  Patient was hospitalized at beginning of Feb with severe weakness.  MRI showed significant white matter disease with demyelinating pattern.  Routine B12 supplementation started.  Spoke with pt at bedside, who reports eating less over the past several months due to early satiety. Upon further questioning, pt shares that he has started to include a lot more fiber in his diet (mainly salads and oatmeal) and typically does not feel as hungry as he did prior to starting to eat higher fiber foods.   Pt reports UBW is around 180#, but typically fluctuates between 170-180# ("if I lose weight, I gain it back quickly"). Pt reports he ate his eggs for breakfast this morning.   Nutrition-Focused physical exam completed. Findings are no fat depletion, no muscle depletion, and no edema. Pt with a thin, athletic build. He shares that he was previously working with physical therapy PTA and was recently discharged due to good progress.   Body mass index is 24.23 kg/m. Patient meets criteria for normal weight range based on current BMI.   Current diet order is regular, patient is consuming approximately 90% of meals at this time. Labs and medications reviewed.   No nutrition interventions warranted at this time. If nutrition issues arise, please consult RD.   Haeden Hudock A. Mayford Knife, RD, LDN, CDE Pager: [phone] After hours Pager: [phone]

## 2016-04-10 DIAGNOSIS — G959 Disease of spinal cord, unspecified: Secondary | ICD-10-CM

## 2016-04-10 LAB — CBC
HEMATOCRIT: 39.9 % (ref 39.0–52.0)
HEMOGLOBIN: 13 g/dL (ref 13.0–17.0)
MCH: 32.1 pg (ref 26.0–34.0)
MCHC: 32.6 g/dL (ref 30.0–36.0)
MCV: 98.5 fL (ref 78.0–100.0)
Platelets: 110 10*3/uL — ABNORMAL LOW (ref 150–400)
RBC: 4.05 MIL/uL — AB (ref 4.22–5.81)
RDW: 16.5 % — AB (ref 11.5–15.5)
WBC: 2.8 10*3/uL — AB (ref 4.0–10.5)

## 2016-04-10 LAB — BASIC METABOLIC PANEL
ANION GAP: 14 (ref 5–15)
BUN: 10 mg/dL (ref 6–20)
CHLORIDE: 96 mmol/L — AB (ref 101–111)
CO2: 23 mmol/L (ref 22–32)
Calcium: 8.8 mg/dL — ABNORMAL LOW (ref 8.9–10.3)
Creatinine, Ser: 1.18 mg/dL (ref 0.61–1.24)
GFR calc non Af Amer: >60 mL/min (ref >60)
GLUCOSE: 105 mg/dL — AB (ref 65–99)
POTASSIUM: 3.6 mmol/L (ref 3.5–5.1)
Sodium: 133 mmol/L — ABNORMAL LOW (ref 135–145)

## 2016-04-10 LAB — HIV ANTIBODY (ROUTINE TESTING W REFLEX): HIV SCREEN 4TH GENERATION: NONREACTIVE

## 2016-04-10 MED ORDER — GUAIFENESIN-DM 100-10 MG/5ML PO SYRP: 5.0000 mL | ORAL_SOLUTION | ORAL | Status: DC | PRN

## 2016-04-10 MED ORDER — LIP MEDEX EX OINT: 1.0000 "application " | TOPICAL_OINTMENT | CUTANEOUS | Status: DC | PRN

## 2016-04-10 MED ORDER — FLUTICASONE PROPIONATE 50 MCG/ACT NA SUSP: 1.0000 | Freq: Every day | NASAL | Status: DC | PRN

## 2016-04-10 MED ORDER — LORATADINE 10 MG PO TABS: 10.0000 mg | ORAL_TABLET | Freq: Every day | ORAL | Status: DC | PRN

## 2016-04-10 MED ORDER — SALINE SPRAY 0.65 % NA SOLN
1.0000 | NASAL | Status: DC | PRN
  Filled 2016-04-10: qty 44

## 2016-04-10 MED ORDER — PHENOL 1.4 % MT LIQD: 1.0000 | OROMUCOSAL | Status: DC | PRN

## 2016-04-10 MED ORDER — CYCLOBENZAPRINE HCL 10 MG PO TABS
10.0000 mg | ORAL_TABLET | Freq: Three times a day (TID) | ORAL | Status: DC | PRN
  Administered 2016-04-10: 10 mg via ORAL
  Filled 2016-04-10: qty 1

## 2016-04-10 NOTE — Progress Notes (Signed)
Notified Dr R.Smith of patient's temp 102.7. Tylenol given as ordered.

## 2016-04-10 NOTE — Progress Notes (Signed)
Neurology Progress Note  Subjective: Febrile to 102.7 overnight. He continues to report fatigue and poor appetite. He had some muscle spasms between his shoulders last night and wants to know if he can have a muscle relaxer (he was using one PRN at home following for spasms related to his myelopathy). He reports that he has not had a BM since 3/7; he usually has one every 2-3 days and uses stool softeners at home. Remainder of 12-pt ROS unremarkable.   Medications reviewed and reconciled.   Pertinent meds: Cyanocobalamin 1000 mcg IM each Friday  Current Meds:   Current Facility-Administered Medications:  .  acetaminophen (TYLENOL) tablet 1,000 mg, 1,000 mg, Oral, Q6H PRN, Hillary Bow, DO, 1,000 mg at 04/10/16 0030 .  cyanocobalamin ((VITAMIN B-12)) injection 1,000 mcg, 1,000 mcg, Intramuscular, Q Fri, Jared M Gardner, DO, 1,000 mcg at 04/09/16 1100 .  cyclobenzaprine (FLEXERIL) tablet 10 mg, 10 mg, Oral, TID PRN, Clydie Braun, MD .  heparin injection 5,000 Units, 5,000 Units, Subcutaneous, Q8H, Hillary Bow, DO, 5,000 Units at 04/10/16 0607 .  Influenza vac split quadrivalent PF (FLUARIX) injection 0.5 mL, 0.5 mL, Intramuscular, Tomorrow-1000, Jared M Gardner, DO .  oseltamivir (TAMIFLU) capsule 75 mg, 75 mg, Oral, BID, Hillary Bow, DO, 75 mg at 04/09/16 2111 .  senna-docusate (Senokot-S) tablet 2 tablet, 2 tablet, Oral, BID, Hillary Bow, DO, 2 tablet at 04/09/16 2111  Objective:  Temp:  [99.3 F (37.4 C)-103.1 F (39.5 C)] 99.7 F (37.6 C) (03/10 0604) Pulse Rate:  [88-122] 88 (03/10 0604) Resp:  [17-18] 17 (03/10 0604) BP: (114-129)/(78-93) 114/81 (03/10 0604) SpO2:  [97 %-100 %] 97 % (03/10 0604)  General: WDWN man lying in bed in NAD. Alert, oriented x4. Speech is clear without dysarthria. Affect is restricted. Comportment is normal.  HEENT: Neck is supple without lymphadenopathy. Mucous membranes are moist and the oropharynx is clear. Sclerae are anicteric. There  is no conjunctival injection.  CV: Regular, no murmur.  Lungs: CTAB  Extremities: No C/C/E. Neuro: MS: As noted above.  CN: Pupils are equal and reactive from 3-->2 mm bilaterally. EOMI, no nystagmus. Facial sensation is intact to light touch. Face is symmetric at rest with normal strength and mobility. Hearing is intact to conversational voice. Voice is normal in tone and quality. Palate elevates symmetrically. Uvula is midline. Bilateral SCM and trapezii are 5/5. Tongue is midline with normal bulk and mobility.  Motor: Normal bulk, tone, and strength throughout. No pronator drift. No tremor or other abnormal movements are observed.  Sensation: Intact to light touch.  DTRs: 3+, symmetric. Toes are downgoing bilaterally.  Coordination: Finger-to-nose is without dysmetria bilaterally.     Labs: Lab Results  Component Value Date   WBC 2.8 (L) 04/10/2016   HGB 13.0 04/10/2016   HCT 39.9 04/10/2016   PLT 110 (L) 04/10/2016   GLUCOSE 105 (H) 04/10/2016   ALT 17 04/08/2016   AST 22 04/08/2016   NA 133 (L) 04/10/2016   K 3.6 04/10/2016   CL 96 (L) 04/10/2016   CREATININE 1.18 04/10/2016   BUN 10 04/10/2016   CO2 23 04/10/2016   CBC Latest Ref Rng & Units 04/10/2016 04/09/2016 04/08/2016  WBC 4.0 - 10.5 K/uL 2.8(L) 3.2(L) 3.4(L)  Hemoglobin 13.0 - 17.0 g/dL 16.1 09.6 04.5  Hematocrit 39.0 - 52.0 % 39.9 41.0 39.3  Platelets 150 - 400 K/uL 110(L) 148(L) 146(L)    No results found for: HGBA1C Lab Results  Component Value Date  ALT 17 04/08/2016   AST 22 04/08/2016   ALKPHOS 93 04/08/2016   BILITOT 0.5 04/08/2016    Radiology:  I have personally and independently reviewed the MRI of the cervical and thoracic spine with and without contrast from 04/09/16. This shows T2 hyperintensity involving the posterior aspect of the cord along its entire length, c/w B12 myelopathy. No real change vs prior scan from 03/10/16.   A/P:   1. Myelopathy: This is due to severe B12 deficiency. MRI scans  show no acute lesions to suggest an alternate diagnosis such as a primary demyelinating disorder. Continue IM B12 supplementation as scheduled. Exacerbation of neurologic deficits is not uncommon in the face of an acute febrile illness, particularly in the case of myelopathy. Continue to treat infection.   2. Abnormality of gait: This is due to myelopathy. PT as needed. Agree with Flexeril PRN.   Needs outpatient neurology follow-up?: As previously arranged.  This was discussed with the patient and his wife. Education was provided on the diagnosis and expected evaluation and treatment. They are in agreement with the plan as noted. They were given the opportunity to ask any questions and these were addressed to their satisfaction.   Rhona Leavens, MD [facility]

## 2016-04-10 NOTE — Progress Notes (Addendum)
PROGRESS NOTE    Daniel Morales  VFI:[id number] DOB: [birth year] DOA: 04/08/2016 PCP: Georgianne Fick, MD  Brief Narrative: Daniel Morales is an 46 y.o. male who presents with subacute worsening of upper and lower extremity weakness. The weakness has made it difficult to walk. The patient states that the weakness is symmetric. He also complains of an uncomfortable zinging and tingling sensation that travels down his neck and spine when he flexes his neck. He was recently diagnosed with B12 myelopathy, now with FLu  Assessment & Plan:   Influenza B -improving, continue IVF and Tamiflu -supportive care,   Leukopenia/thrombocytopenia -due to flu most likely  Weakness/B12 myelopathy -symptoms exacerbated by FLu - Neurology recommendations appreciated, continue Weekly B12 replacement - PTOT evaluation - MRI of the C-spine and thoracic spine with no evidence of active demyelination - being followed by [facility] -appreciate Neuro input  DVT prophylaxis: lovenox Code Status:Full  Family Communication:none at bedside Disposition Plan:home tomorrow if better   Consultants:  Neuro  Antimicrobials: tamiflu  Subjective: Feels better, febrile this am  Objective: Vitals:   04/09/16 1800 04/09/16 2104 04/10/16 0019 04/10/16 0604  BP:  (!) 129/93  114/81  Pulse:  (!) 110  88  Resp:  18  17  Temp: 99.3 F (37.4 C) 99.7 F (37.6 C) (!) 102.7 F (39.3 C) 99.7 F (37.6 C)  TempSrc: Oral Oral Oral Oral  SpO2:  100%  97%  Weight:      Height:        Intake/Output Summary (Last 24 hours) at 04/10/16 1120 Last data filed at 04/10/16 0605  Gross per 24 hour  Intake              120 ml  Output                1 ml  Net              119 ml   Filed Weights   04/08/16 1728 04/09/16 0419  Weight: 78.5 kg (173 lb) 77.7 kg (171 lb 4.8 oz)    Examination:  General exam: Appears calm and comfortable  Respiratory system: Clear to auscultation. Respiratory effort  normal. Cardiovascular system: S1 & S2 heard, RRR. No JVD, murmurs, rubs, gallops or clicks. No pedal edema. Gastrointestinal system: Abdomen is nondistended, soft and nontender.Normal bowel sounds heard. Central nervous system: Alert and oriented. Mild sensory deficits in both legs Extremities: Symmetric 5 x 5 power. Skin: No rashes, lesions or ulcers Psychiatry: Judgement and insight appear normal. Mood & affect appropriate.     Data Reviewed: I have personally reviewed following labs and imaging studies  CBC:  Recent Labs Lab 04/08/16 1912 04/09/16 1002 04/10/16 0445  WBC 3.4* 3.2* 2.8*  NEUTROABS 2.1  --   --   HGB 13.0 13.6 13.0  HCT 39.3 41.0 39.9  MCV 99.2 99.0 98.5  PLT 146* 148* 110*   Basic Metabolic Panel:  Recent Labs Lab 04/08/16 1912 04/09/16 1002 04/10/16 0445  NA 138 137 133*  K 3.4* 3.8 3.6  CL 104 103 96*  CO2 25 26 23   GLUCOSE 89 88 105*  BUN 8 9 10   CREATININE 1.09 1.07 1.18  CALCIUM 9.1 9.2 8.8*  MG  --  1.7  --    GFR: Estimated Creatinine Clearance: 82.1 mL/min (by C-G formula based on SCr of 1.18 mg/dL). Liver Function Tests:  Recent Labs Lab 04/08/16 1912  AST 22  ALT 17  ALKPHOS 93  BILITOT 0.5  PROT 7.2  ALBUMIN 3.5   No results for input(s): LIPASE, AMYLASE in the last 168 hours. No results for input(s): AMMONIA in the last 168 hours. Coagulation Profile: No results for input(s): INR, PROTIME in the last 168 hours. Cardiac Enzymes: No results for input(s): CKTOTAL, CKMB, CKMBINDEX, TROPONINI in the last 168 hours. BNP (last 3 results) No results for input(s): PROBNP in the last 8760 hours. HbA1C: No results for input(s): HGBA1C in the last 72 hours. CBG: No results for input(s): GLUCAP in the last 168 hours. Lipid Profile: No results for input(s): CHOL, HDL, LDLCALC, TRIG, CHOLHDL, LDLDIRECT in the last 72 hours. Thyroid Function Tests: No results for input(s): TSH, T4TOTAL, FREET4, T3FREE, THYROIDAB in the last 72  hours. Anemia Panel:  Recent Labs  04/08/16 1912  VITAMINB12 670   Urine analysis:    Component Value Date/Time   COLORURINE YELLOW 04/08/2016 2146   APPEARANCEUR CLEAR 04/08/2016 2146   LABSPEC 1.027 04/08/2016 2146   PHURINE 5.0 04/08/2016 2146   GLUCOSEU NEGATIVE 04/08/2016 2146   HGBUR NEGATIVE 04/08/2016 2146   BILIRUBINUR NEGATIVE 04/08/2016 2146   KETONESUR 5 (A) 04/08/2016 2146   PROTEINUR 30 (A) 04/08/2016 2146   UROBILINOGEN 4.0 (H) 04/19/2011 2207   NITRITE NEGATIVE 04/08/2016 2146   LEUKOCYTESUR NEGATIVE 04/08/2016 2146   Sepsis Labs: @LABRCNTIP (procalcitonin:4,lacticidven:4)  ) Recent Results (from the past 240 hour(s))  Culture, blood (routine x 2)     Status: None (Preliminary result)   Collection Time: 04/08/16  7:17 PM  Result Value Ref Range Status   Specimen Description BLOOD RIGHT ANTECUBITAL  Final   Special Requests IN PEDIATRIC BOTTLE 4CC  Final   Culture NO GROWTH 2 DAYS  Final   Report Status PENDING  Incomplete  Culture, blood (routine x 2)     Status: None (Preliminary result)   Collection Time: 04/08/16  7:22 PM  Result Value Ref Range Status   Specimen Description BLOOD RIGHT FOREARM  Final   Special Requests IN PEDIATRIC BOTTLE 4CC  Final   Culture NO GROWTH 2 DAYS  Final   Report Status PENDING  Incomplete         Radiology Studies: Dg Chest 2 View  Result Date: 04/08/2016 CLINICAL DATA:  Weakness fatigue and cough EXAM: CHEST  2 VIEW COMPARISON:  03/14/2016 FINDINGS: The heart size and mediastinal contours are within normal limits. Both lungs are clear. The visualized skeletal structures are unremarkable. Surgical clips in the right upper quadrant. IMPRESSION: No active cardiopulmonary disease. Electronically Signed   By: Jasmine Pang M.D.   On: 04/08/2016 19:03   Mr Cervical Spine W Or Wo Contrast  Result Date: 04/09/2016 CLINICAL DATA:  Initial evaluation for acute severe weakness. History of demyelinating disorder. EXAM: MRI  CERVICAL AND thoracic SPINE WITHOUT AND WITH CONTRAST TECHNIQUE: Multiplanar and multiecho pulse sequences of the cervical spine, to include the craniocervical junction and cervicothoracic junction, and lumbar spine, were obtained without and with intravenous contrast. CONTRAST:  15mL MULTIHANCE GADOBENATE DIMEGLUMINE 529 MG/ML IV SOLN COMPARISON:  Comparison made with previous brain MRI from 03/09/2016 as well as MRIs of the cervical and thoracic spine from 03/10/2016. FINDINGS: MRI CERVICAL SPINE FINDINGS Alignment: Stable alignment with preservation of the normal cervical lordosis. No listhesis. Vertebrae: Vertebral body heights maintained. Signal intensity within the vertebral body bone marrow is normal. Cord: Abnormal T2 signal intensity within the dorsal aspect of the cord beginning at the foramen magnum and extending through the C7 level, overall little interval changed as  compared to previous. No significant mass effect or edema. No abnormal enhancement. Again, while this finding could be related to demyelinating disease, assess also somewhat characteristic for subacute combined degeneration. Posterior Fossa, vertebral arteries, paraspinal tissues: Visualized brain and posterior fossa unremarkable. Craniocervical junction normal. Paraspinous and prevertebral soft tissues normal. Normal flow voids present within the vertebral arteries bilaterally. Disc levels: Mild degenerative spondylolysis extending from C3-4 through C7-T1. Minimal narrowing of the ventral subarachnoid space without significant stenosis. Mild multilevel foraminal narrowing is stable. MRI LUMBAR SPINE FINDINGS Alignment: Vertebral bodies normally aligned with preservation of the normal thoracic kyphosis. Vertebrae: Vertebral body heights are maintained. No evidence for acute or chronic fracture. Signal intensity within the vertebral body bone marrow is normal. Cord: Subtle signal abnormality within the dorsal aspect of the thoracic spinal  cord, again most evident at the T7 through T10 regions (series 17, image 8). There is probable minimal patchy signal abnormality within the right aspect of the cord at the level of T2-3 (series 14, image 11). On review of previous study, this appears to have been present on prior exam, although more prominent on today's study. There is a small right paracentral disc protrusion at this level with flattening of the right hemi cord, which may be contributing somewhat to this finding. No associated edema or enhancement. Paraspinal and other soft tissues: Paraspinous soft tissues within normal limits. Visualized lungs are clear. Visualized vessel structures within normal limits. Disc levels: Small right paracentral disc protrusion at T3-4 (series 11, image 7). Minimal flattening of the right ventral thecal sac with flattening of the right hemi cord. No significant stenosis. No other significant degenerative changes within the thoracic spine. No canal or foraminal stenosis. IMPRESSION: MRI CERVICAL SPINE IMPRESSION: Stable exam with abnormal signal involving the dorsal cord extending from the foramen magnum through approximately the C7 level. Again, while these findings may be related to underlying demyelinating disease, appearance is somewhat suggestive of subacute combined degeneration. No findings to suggest active demyelination. MRI THORACIC SPINE IMPRESSION: Stable exam with abnormal signal within thoracic spinal cord as above, most notable within the dorsal cord in the region of T7-T10. Findings could be related to demyelinating disease and/or subacute combined degeneration. No evidence for active demyelination. Electronically Signed   By: Rise Mu M.D.   On: 04/09/2016 04:28   Mr Thoracic Spine W Wo Contrast  Result Date: 04/09/2016 CLINICAL DATA:  Initial evaluation for acute severe weakness. History of demyelinating disorder. EXAM: MRI CERVICAL AND thoracic SPINE WITHOUT AND WITH CONTRAST  TECHNIQUE: Multiplanar and multiecho pulse sequences of the cervical spine, to include the craniocervical junction and cervicothoracic junction, and lumbar spine, were obtained without and with intravenous contrast. CONTRAST:  15mL MULTIHANCE GADOBENATE DIMEGLUMINE 529 MG/ML IV SOLN COMPARISON:  Comparison made with previous brain MRI from 03/09/2016 as well as MRIs of the cervical and thoracic spine from 03/10/2016. FINDINGS: MRI CERVICAL SPINE FINDINGS Alignment: Stable alignment with preservation of the normal cervical lordosis. No listhesis. Vertebrae: Vertebral body heights maintained. Signal intensity within the vertebral body bone marrow is normal. Cord: Abnormal T2 signal intensity within the dorsal aspect of the cord beginning at the foramen magnum and extending through the C7 level, overall little interval changed as compared to previous. No significant mass effect or edema. No abnormal enhancement. Again, while this finding could be related to demyelinating disease, assess also somewhat characteristic for subacute combined degeneration. Posterior Fossa, vertebral arteries, paraspinal tissues: Visualized brain and posterior fossa unremarkable. Craniocervical junction normal. Paraspinous and  prevertebral soft tissues normal. Normal flow voids present within the vertebral arteries bilaterally. Disc levels: Mild degenerative spondylolysis extending from C3-4 through C7-T1. Minimal narrowing of the ventral subarachnoid space without significant stenosis. Mild multilevel foraminal narrowing is stable. MRI LUMBAR SPINE FINDINGS Alignment: Vertebral bodies normally aligned with preservation of the normal thoracic kyphosis. Vertebrae: Vertebral body heights are maintained. No evidence for acute or chronic fracture. Signal intensity within the vertebral body bone marrow is normal. Cord: Subtle signal abnormality within the dorsal aspect of the thoracic spinal cord, again most evident at the T7 through T10 regions  (series 17, image 8). There is probable minimal patchy signal abnormality within the right aspect of the cord at the level of T2-3 (series 14, image 11). On review of previous study, this appears to have been present on prior exam, although more prominent on today's study. There is a small right paracentral disc protrusion at this level with flattening of the right hemi cord, which may be contributing somewhat to this finding. No associated edema or enhancement. Paraspinal and other soft tissues: Paraspinous soft tissues within normal limits. Visualized lungs are clear. Visualized vessel structures within normal limits. Disc levels: Small right paracentral disc protrusion at T3-4 (series 11, image 7). Minimal flattening of the right ventral thecal sac with flattening of the right hemi cord. No significant stenosis. No other significant degenerative changes within the thoracic spine. No canal or foraminal stenosis. IMPRESSION: MRI CERVICAL SPINE IMPRESSION: Stable exam with abnormal signal involving the dorsal cord extending from the foramen magnum through approximately the C7 level. Again, while these findings may be related to underlying demyelinating disease, appearance is somewhat suggestive of subacute combined degeneration. No findings to suggest active demyelination. MRI THORACIC SPINE IMPRESSION: Stable exam with abnormal signal within thoracic spinal cord as above, most notable within the dorsal cord in the region of T7-T10. Findings could be related to demyelinating disease and/or subacute combined degeneration. No evidence for active demyelination. Electronically Signed   By: Rise Mu M.D.   On: 04/09/2016 04:28        Scheduled Meds: . cyanocobalamin  1,000 mcg Intramuscular Q Fri  . heparin  5,000 Units Subcutaneous Q8H  . Influenza vac split quadrivalent PF  0.5 mL Intramuscular Tomorrow-1000  . oseltamivir  75 mg Oral BID  . senna-docusate  2 tablet Oral BID   Continuous  Infusions:   LOS: 1 day    Time spent:    Zannie Cove, MD [facility] Pager [phone]  If 7PM-7AM, please contact night-coverage [email or web address] Password [email or web address] 04/10/2016, 11:20 AM

## 2016-04-11 MED ORDER — OSELTAMIVIR PHOSPHATE 75 MG PO CAPS: 75.0000 mg | ORAL_CAPSULE | Freq: Two times a day (BID) | ORAL | 0 refills | Status: AC

## 2016-04-11 NOTE — Evaluation (Signed)
Physical Therapy Evaluation Patient Details Name: Daniel Morales MRN: [id number] DOB: 14-Jan-1971 Today's Date: 04/11/2016   History of Present Illness  Daniel Morales is an 46 y.o. male who presents with subacute worsening of upper and lower extremity weakness. The weakness has made it difficult to walk. The patient states that the weakness is symmetric. He also complains of an uncomfortable zinging and tingling sensation that travels down his neck and spine when he flexes his neck. He was recently diagnosed with B12 myelopathy, now with FLu  Clinical Impression   Patient evaluated by Physical Therapy with no further acute PT needs identified. All education has been completed and the patient has no further questions. Managing well; Independent with walking and mobility; OK to walk hallways independently -- educated Daniel Morales to wash hands and wear mask when he walks the hallways;  See below for any follow-up Physical Therapy or equipment needs. PT is signing off. Thank you for this referral.     Follow Up Recommendations No PT follow up    Equipment Recommendations  None recommended by PT    Recommendations for Other Services       Precautions / Restrictions Precautions Precautions: Other (comment) Precaution Comments: Droplet for flu      Mobility  Bed Mobility               General bed mobility comments: Reports no difficulty getting up from bed  Transfers Overall transfer level: Independent                  Ambulation/Gait Ambulation/Gait assistance: Independent Ambulation Distance (Feet): 550 Feet Assistive device: None Gait Pattern/deviations: WFL(Within Functional Limits)   Gait velocity interpretation: at or above normal speed for age/gender General Gait Details: No difficulty  Stairs            Wheelchair Mobility    Modified Rankin (Stroke Patients Only)       Balance Overall balance assessment: No apparent balance deficits (not  formally assessed)                                           Pertinent Vitals/Pain Pain Assessment: No/denies pain    Home Living Family/patient expects to be discharged to:: Private residence Living Arrangements: Spouse/significant other Available Help at Discharge: Family Type of Home: House Home Access: Level entry     Home Layout: One level Home Equipment: Gilmer Mor - single point      Prior Function Level of Independence: Independent         Comments: works as Naval architect, Pharmacist, community   Dominant Hand: Right    Extremity/Trunk Assessment   Upper Extremity Assessment Upper Extremity Assessment: Overall WFL for tasks assessed    Lower Extremity Assessment Lower Extremity Assessment: Overall WFL for tasks assessed (for simple mobility)    Cervical / Trunk Assessment Cervical / Trunk Assessment: Normal  Communication   Communication: No difficulties  Cognition Arousal/Alertness: Awake/alert Behavior During Therapy: WFL for tasks assessed/performed Overall Cognitive Status: Within Functional Limits for tasks assessed                      General Comments      Exercises     Assessment/Plan    PT Assessment Patent does not need any further PT services  PT Problem List  PT Treatment Interventions      PT Goals (Current goals can be found in the Care Plan section)  Acute Rehab PT Goals Patient Stated Goal: Home soon PT Goal Formulation: All assessment and education complete, DC therapy    Frequency     Barriers to discharge        Co-evaluation               End of Session   Activity Tolerance: Patient tolerated treatment well Patient left: Other (comment) (managing independently in the room)   PT Visit Diagnosis: Muscle weakness (generalized) (M62.81)         Time: 1610-9604 PT Time Calculation (min) (ACUTE ONLY): 15 min   Charges:   PT Evaluation $PT Eval Low Complexity: 1  Procedure     PT G CodesLevi Aland 04/11/2016, 9:41 AM  Van Clines, PT  Acute Rehabilitation Services Pager [phone] Office [phone]

## 2016-04-11 NOTE — Discharge Summary (Signed)
Physician Discharge Summary  Daniel Morales ZOX:[id number] DOB: March 05, 1970 DOA: 04/08/2016  PCP: Daniel Fick, MD  Admit date: 04/08/2016 Discharge date: 04/11/2016  Time spent: 35 minutes  Recommendations for Outpatient Follow-up:  1. PCP in 1 week, continue Weekly B12 injections 2. FU with [facility] as before 3. Recommend non urgent GI follow up   Discharge Diagnoses:  Principal Problem:   Demyelinating disease (HCC)   Subacute combined degeneration of spinal cord   Influenza B   Discharge Condition: stable  Diet recommendation: regular  Filed Weights   04/08/16 1728 04/09/16 0419  Weight: 78.5 kg (173 lb) 77.7 kg (171 lb 4.8 oz)    History of present illness:  Daniel Stringfellowis an 46 y.o.malewho presents with subacute worsening of upper and lower extremity weakness. The weakness has made it difficult to walk. The patient states that the weakness is symmetric. He also complains of an uncomfortable zinging and tingling sensation that travels down his neck and spine when he flexes his neck  Hospital Course:  Influenza B -improved with supportive care IVF and Tamiflu -discharged home on tamiflu to complete course  Leukopenia/thrombocytopenia -due to flu most likely, needs recheck in 79month, suspect B12 defi also contribtuing  Weakness/B12 myelopathy -symptoms exacerbated by FLu - Neurology consulted, recommended to continue Weekly B12 replacement - MRI of the C-spine and thoracic spine with no evidence of active demyelination - being followed by [facility] - will continue B12 replacement after discharge, ambulated with PT, no FU recommended  Discharge Exam: Vitals:   04/11/16 0542 04/11/16 0900  BP: 124/88   Pulse: 100   Resp: 18   Temp: 100 F (37.8 C) 99.5 F (37.5 C)    General: AAOx3 Cardiovascular: S1S2/RRR Respiratory: CTAB  Discharge Instructions   Discharge Instructions    Diet - low sodium heart healthy    Complete  by:  As directed    Increase activity slowly    Complete by:  As directed      Current Discharge Medication List    START taking these medications   Details  oseltamivir (TAMIFLU) 75 MG capsule Take 1 capsule (75 mg total) by mouth 2 (two) times daily. For 3days Qty: 5 capsule, Refills: 0      CONTINUE these medications which have NOT CHANGED   Details  cyanocobalamin (,VITAMIN B-12,) 1000 MCG/ML injection Intramuscular daily for another 3 days, then once a week 4 weeks, then once a month Qty: 1 mL, Refills: 30    Dextromethorphan HBr (VICKS DAYQUIL COUGH PO) Take 1 tablet by mouth as needed (for cough).    senna-docusate (SENOKOT-S) 8.6-50 MG tablet Take 2 tablets by mouth 2 (two) times daily. Qty: 120 tablet, Refills: 2       No Known Allergies Follow-up Information    Morales,AJITH, MD. Schedule an appointment as soon as possible for a visit in 1 week(s).   Specialty:  Internal Medicine Contact information: [address] [place] [address] [place] [address] [phone]            The results of significant diagnostics from this hospitalization (including imaging, microbiology, ancillary and laboratory) are listed below for reference.    Significant Diagnostic Studies: Dg Chest 2 View  Result Date: 04/08/2016 CLINICAL DATA:  Weakness fatigue and cough EXAM: CHEST  2 VIEW COMPARISON:  03/14/2016 FINDINGS: The heart size and mediastinal contours are within normal limits. Both lungs are clear. The visualized skeletal structures are unremarkable. Surgical clips in the right upper quadrant. IMPRESSION: No active cardiopulmonary  disease. Electronically Signed   By: Jasmine Pang M.D.   On: 04/08/2016 19:03   Ct Angio Chest Pe W Or Wo Contrast  Result Date: 03/14/2016 CLINICAL DATA:  Shortness of Breath EXAM: CT ANGIOGRAPHY CHEST WITH CONTRAST TECHNIQUE: Multidetector CT imaging of the chest was performed using the standard protocol during bolus administration of  intravenous contrast. Multiplanar CT image reconstructions and MIPs were obtained to evaluate the vascular anatomy. CONTRAST:  80 mL Isovue 370. COMPARISON:  None. FINDINGS: Cardiovascular: Thoracic aorta is within normal limits. Pulmonary artery is well visualize with a normal branching pattern. No filling defects are noted. Mediastinum/Nodes: Thoracic inlet is within normal limits. No hilar or mediastinal adenopathy is noted. Lungs/Pleura: Lungs are well aerated bilaterally. Minimal bibasilar atelectasis is noted. Upper Abdomen: No acute abnormality. Musculoskeletal: No chest wall abnormality. No acute or significant osseous findings. Review of the MIP images confirms the above findings. IMPRESSION: No evidence of pulmonary emboli. Mild bibasilar atelectasis is noted. Electronically Signed   By: Alcide Clever M.D.   On: 03/14/2016 19:34   Mr Cervical Spine W Or Wo Contrast  Result Date: 04/09/2016 CLINICAL DATA:  Initial evaluation for acute severe weakness. History of demyelinating disorder. EXAM: MRI CERVICAL AND thoracic SPINE WITHOUT AND WITH CONTRAST TECHNIQUE: Multiplanar and multiecho pulse sequences of the cervical spine, to include the craniocervical junction and cervicothoracic junction, and lumbar spine, were obtained without and with intravenous contrast. CONTRAST:  15mL MULTIHANCE GADOBENATE DIMEGLUMINE 529 MG/ML IV SOLN COMPARISON:  Comparison made with previous brain MRI from 03/09/2016 as well as MRIs of the cervical and thoracic spine from 03/10/2016. FINDINGS: MRI CERVICAL SPINE FINDINGS Alignment: Stable alignment with preservation of the normal cervical lordosis. No listhesis. Vertebrae: Vertebral body heights maintained. Signal intensity within the vertebral body bone marrow is normal. Cord: Abnormal T2 signal intensity within the dorsal aspect of the cord beginning at the foramen magnum and extending through the C7 level, overall little interval changed as compared to previous. No  significant mass effect or edema. No abnormal enhancement. Again, while this finding could be related to demyelinating disease, assess also somewhat characteristic for subacute combined degeneration. Posterior Fossa, vertebral arteries, paraspinal tissues: Visualized brain and posterior fossa unremarkable. Craniocervical junction normal. Paraspinous and prevertebral soft tissues normal. Normal flow voids present within the vertebral arteries bilaterally. Disc levels: Mild degenerative spondylolysis extending from C3-4 through C7-T1. Minimal narrowing of the ventral subarachnoid space without significant stenosis. Mild multilevel foraminal narrowing is stable. MRI LUMBAR SPINE FINDINGS Alignment: Vertebral bodies normally aligned with preservation of the normal thoracic kyphosis. Vertebrae: Vertebral body heights are maintained. No evidence for acute or chronic fracture. Signal intensity within the vertebral body bone marrow is normal. Cord: Subtle signal abnormality within the dorsal aspect of the thoracic spinal cord, again most evident at the T7 through T10 regions (series 17, image 8). There is probable minimal patchy signal abnormality within the right aspect of the cord at the level of T2-3 (series 14, image 11). On review of previous study, this appears to have been present on prior exam, although more prominent on today's study. There is a small right paracentral disc protrusion at this level with flattening of the right hemi cord, which may be contributing somewhat to this finding. No associated edema or enhancement. Paraspinal and other soft tissues: Paraspinous soft tissues within normal limits. Visualized lungs are clear. Visualized vessel structures within normal limits. Disc levels: Small right paracentral disc protrusion at T3-4 (series 11, image 7). Minimal flattening of  the right ventral thecal sac with flattening of the right hemi cord. No significant stenosis. No other significant degenerative  changes within the thoracic spine. No canal or foraminal stenosis. IMPRESSION: MRI CERVICAL SPINE IMPRESSION: Stable exam with abnormal signal involving the dorsal cord extending from the foramen magnum through approximately the C7 level. Again, while these findings may be related to underlying demyelinating disease, appearance is somewhat suggestive of subacute combined degeneration. No findings to suggest active demyelination. MRI THORACIC SPINE IMPRESSION: Stable exam with abnormal signal within thoracic spinal cord as above, most notable within the dorsal cord in the region of T7-T10. Findings could be related to demyelinating disease and/or subacute combined degeneration. No evidence for active demyelination. Electronically Signed   By: Rise Mu M.D.   On: 04/09/2016 04:28   Mr Thoracic Spine W Wo Contrast  Result Date: 04/09/2016 CLINICAL DATA:  Initial evaluation for acute severe weakness. History of demyelinating disorder. EXAM: MRI CERVICAL AND thoracic SPINE WITHOUT AND WITH CONTRAST TECHNIQUE: Multiplanar and multiecho pulse sequences of the cervical spine, to include the craniocervical junction and cervicothoracic junction, and lumbar spine, were obtained without and with intravenous contrast. CONTRAST:  15mL MULTIHANCE GADOBENATE DIMEGLUMINE 529 MG/ML IV SOLN COMPARISON:  Comparison made with previous brain MRI from 03/09/2016 as well as MRIs of the cervical and thoracic spine from 03/10/2016. FINDINGS: MRI CERVICAL SPINE FINDINGS Alignment: Stable alignment with preservation of the normal cervical lordosis. No listhesis. Vertebrae: Vertebral body heights maintained. Signal intensity within the vertebral body bone marrow is normal. Cord: Abnormal T2 signal intensity within the dorsal aspect of the cord beginning at the foramen magnum and extending through the C7 level, overall little interval changed as compared to previous. No significant mass effect or edema. No abnormal enhancement.  Again, while this finding could be related to demyelinating disease, assess also somewhat characteristic for subacute combined degeneration. Posterior Fossa, vertebral arteries, paraspinal tissues: Visualized brain and posterior fossa unremarkable. Craniocervical junction normal. Paraspinous and prevertebral soft tissues normal. Normal flow voids present within the vertebral arteries bilaterally. Disc levels: Mild degenerative spondylolysis extending from C3-4 through C7-T1. Minimal narrowing of the ventral subarachnoid space without significant stenosis. Mild multilevel foraminal narrowing is stable. MRI LUMBAR SPINE FINDINGS Alignment: Vertebral bodies normally aligned with preservation of the normal thoracic kyphosis. Vertebrae: Vertebral body heights are maintained. No evidence for acute or chronic fracture. Signal intensity within the vertebral body bone marrow is normal. Cord: Subtle signal abnormality within the dorsal aspect of the thoracic spinal cord, again most evident at the T7 through T10 regions (series 17, image 8). There is probable minimal patchy signal abnormality within the right aspect of the cord at the level of T2-3 (series 14, image 11). On review of previous study, this appears to have been present on prior exam, although more prominent on today's study. There is a small right paracentral disc protrusion at this level with flattening of the right hemi cord, which may be contributing somewhat to this finding. No associated edema or enhancement. Paraspinal and other soft tissues: Paraspinous soft tissues within normal limits. Visualized lungs are clear. Visualized vessel structures within normal limits. Disc levels: Small right paracentral disc protrusion at T3-4 (series 11, image 7). Minimal flattening of the right ventral thecal sac with flattening of the right hemi cord. No significant stenosis. No other significant degenerative changes within the thoracic spine. No canal or foraminal  stenosis. IMPRESSION: MRI CERVICAL SPINE IMPRESSION: Stable exam with abnormal signal involving the dorsal cord extending from the  foramen magnum through approximately the C7 level. Again, while these findings may be related to underlying demyelinating disease, appearance is somewhat suggestive of subacute combined degeneration. No findings to suggest active demyelination. MRI THORACIC SPINE IMPRESSION: Stable exam with abnormal signal within thoracic spinal cord as above, most notable within the dorsal cord in the region of T7-T10. Findings could be related to demyelinating disease and/or subacute combined degeneration. No evidence for active demyelination. Electronically Signed   By: Rise Mu M.D.   On: 04/09/2016 04:28    Microbiology: Recent Results (from the past 240 hour(s))  Culture, blood (routine x 2)     Status: None (Preliminary result)   Collection Time: 04/08/16  7:17 PM  Result Value Ref Range Status   Specimen Description BLOOD RIGHT ANTECUBITAL  Final   Special Requests IN PEDIATRIC BOTTLE 4CC  Final   Culture NO GROWTH 2 DAYS  Final   Report Status PENDING  Incomplete  Culture, blood (routine x 2)     Status: None (Preliminary result)   Collection Time: 04/08/16  7:22 PM  Result Value Ref Range Status   Specimen Description BLOOD RIGHT FOREARM  Final   Special Requests IN PEDIATRIC BOTTLE 4CC  Final   Culture NO GROWTH 2 DAYS  Final   Report Status PENDING  Incomplete     Labs: Basic Metabolic Panel:  Recent Labs Lab 04/08/16 1912 04/09/16 1002 04/10/16 0445  NA 138 137 133*  K 3.4* 3.8 3.6  CL 104 103 96*  CO2 25 26 23   GLUCOSE 89 88 105*  BUN 8 9 10   CREATININE 1.09 1.07 1.18  CALCIUM 9.1 9.2 8.8*  MG  --  1.7  --    Liver Function Tests:  Recent Labs Lab 04/08/16 1912  AST 22  ALT 17  ALKPHOS 93  BILITOT 0.5  PROT 7.2  ALBUMIN 3.5   No results for input(s): LIPASE, AMYLASE in the last 168 hours. No results for input(s): AMMONIA in  the last 168 hours. CBC:  Recent Labs Lab 04/08/16 1912 04/09/16 1002 04/10/16 0445  WBC 3.4* 3.2* 2.8*  NEUTROABS 2.1  --   --   HGB 13.0 13.6 13.0  HCT 39.3 41.0 39.9  MCV 99.2 99.0 98.5  PLT 146* 148* 110*   Cardiac Enzymes: No results for input(s): CKTOTAL, CKMB, CKMBINDEX, TROPONINI in the last 168 hours. BNP: BNP (last 3 results) No results for input(s): BNP in the last 8760 hours.  ProBNP (last 3 results) No results for input(s): PROBNP in the last 8760 hours.  CBG: No results for input(s): GLUCAP in the last 168 hours.     SignedZannie Cove MD.  [facility] 04/11/2016, 12:42 PM

## 2016-04-13 LAB — CULTURE, BLOOD (ROUTINE X 2)
CULTURE: NO GROWTH
Culture: NO GROWTH

## 2016-07-02 ENCOUNTER — Ambulatory Visit: Payer: 59 | Attending: Psychiatry

## 2016-07-02 DIAGNOSIS — E538 Deficiency of other specified B group vitamins: Secondary | ICD-10-CM | POA: Diagnosis present

## 2016-07-02 DIAGNOSIS — G320 Subacute combined degeneration of spinal cord in diseases classified elsewhere: Secondary | ICD-10-CM | POA: Diagnosis present

## 2016-07-02 DIAGNOSIS — M545 Low back pain, unspecified: Secondary | ICD-10-CM

## 2016-07-02 DIAGNOSIS — M79641 Pain in right hand: Secondary | ICD-10-CM | POA: Diagnosis present

## 2016-07-02 DIAGNOSIS — M79642 Pain in left hand: Secondary | ICD-10-CM | POA: Diagnosis present

## 2016-07-02 DIAGNOSIS — M25572 Pain in left ankle and joints of left foot: Secondary | ICD-10-CM

## 2016-07-02 DIAGNOSIS — G8929 Other chronic pain: Secondary | ICD-10-CM | POA: Insufficient documentation

## 2016-07-02 DIAGNOSIS — M79672 Pain in left foot: Secondary | ICD-10-CM | POA: Insufficient documentation

## 2016-07-02 DIAGNOSIS — M79671 Pain in right foot: Secondary | ICD-10-CM | POA: Insufficient documentation

## 2016-07-02 DIAGNOSIS — M25571 Pain in right ankle and joints of right foot: Secondary | ICD-10-CM

## 2016-07-02 DIAGNOSIS — M6281 Muscle weakness (generalized): Secondary | ICD-10-CM | POA: Diagnosis present

## 2016-07-02 NOTE — Therapy (Signed)
[facility] [address] [place], [place], [address] Phone: [phone]   Fax:  [phone]  Physical Therapy Evaluation/FCE  Patient Details  Name: Daniel Morales MRN: [id number] Date of Birth: Jan 29, 1971 No Data Recorded  Encounter Date: 07/02/2016      PT End of Session - 07/02/16 1158    Visit Number 1   Number of Visits 1   Authorization Type [employer]   PT Start Time 0800   PT Stop Time 1135   PT Time Calculation (min) 215 min   Activity Tolerance Patient limited by fatigue;Patient limited by pain   Behavior During Therapy [facility] for tasks assessed/performed      Past Medical History:  Diagnosis Date  . Constipation   . Environmental allergies    year round  . GERD (gastroesophageal reflux disease)    "heartburn at times"  . Headache(784.0)   . Shortness of breath    "bc of my allergies"    Past Surgical History:  Procedure Laterality Date  . CHOLECYSTECTOMY  04/21/2011   Procedure: LAPAROSCOPIC CHOLECYSTECTOMY WITH INTRAOPERATIVE CHOLANGIOGRAM;  Surgeon: Valarie Merino, MD;  Location: [facility];  Service: General;  Laterality: N/A;  . CIRCUMCISION    . ERCP  04/20/2011   Procedure: ENDOSCOPIC RETROGRADE CHOLANGIOPANCREATOGRAPHY (ERCP);  Surgeon: Petra Kuba, MD;  Location: [facility];  Service: Endoscopy;  Laterality: N/A;    There were no vitals filed for this visit.       Subjective Assessment - 07/02/16 1157    Subjective See FCE report                Objective measurements completed on examination: See above findings.                  PT Education - 07/02/16 1157    Education provided Yes   Education Details Reveiewed Child psychotherapist. Suggested he stay active but not over exert for next 2-4 days due to possibel exercise soreness   Person(s) Educated Patient   Methods Explanation   Comprehension Verbalized understanding              WILL FAX FCE REPORT TO DR  Elwyn Reach       Plan - 07/02/16 1203    Clinical Impression Statement Mr Ess completed the FCE process with incr pain and reports of fatigue.  He wa srated at sedentary level but with inconsistencies and self limiting some tasks this is the minimum suggested level of work and not max.  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 Influenza B 04/09/2016  . Demyelinating disease (HCC)   . Anemia due to vitamin B12 deficiency   . 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 Multiple sclerosis (HCC) 03/09/2016  . Constipation 03/09/2016  . S/P laparoscopic cholecystectomy & ERCP March 2013 05/06/2011  . Symptomatic cholelithiasis 04/20/2011  . Choledocholithiasis 04/20/2011  Darrel Hoover PT 07/02/2016, 12:09 PM  [facility] [address] [place], [place], [address] Phone: [phone]   Fax:  [phone]  Name: Elzie Knisley MRN: [id number] Date of Birth: 1971-01-03 PHYSICAL THERAPY DISCHARGE SUMMARY  Visits from Start of Care: FCE 1 visit  Current functional level related to goals / functional outcomes: NA   Remaining deficits: NA   Education / Equipment: NA  Plan: Patient agrees to discharge.  Patient goals were not met. Patient is being discharged due to                                                     ?????    Completion of FCE testing

## 2016-11-12 ENCOUNTER — Telehealth: Payer: Self-pay

## 2016-11-12 NOTE — Telephone Encounter (Signed)
Patient called saying that he needs someone to call back because he needs information about his upcoming appointment.

## 2016-11-12 NOTE — Telephone Encounter (Signed)
Patient had wrong office.

## 2016-12-20 ENCOUNTER — Ambulatory Visit: Payer: 59 | Admitting: Neurology

## 2017-01-06 ENCOUNTER — Encounter: Payer: Self-pay | Admitting: Psychology

## 2017-12-30 IMAGING — CT CT ANGIO CHEST
2 of 6 series · 18 of 36 positions shown · IV contrast (isovue)
Comparison: None.

CLINICAL DATA: Shortness of Breath

EXAM:
CT ANGIOGRAPHY CHEST WITH CONTRAST
TECHNIQUE: Multidetector CT imaging of the chest was performed using the
standard protocol during bolus administration of intravenous
contrast. Multiplanar CT image reconstructions and MIPs were
obtained to evaluate the vascular anatomy.
CONTRAST:  80 mL Isovue 370.

[Series 6: pe thins · axial · 0.78mm/px · z∈[+1016,+1232]mm · 17 of 244 slices shown]
[im 14/244  lung]
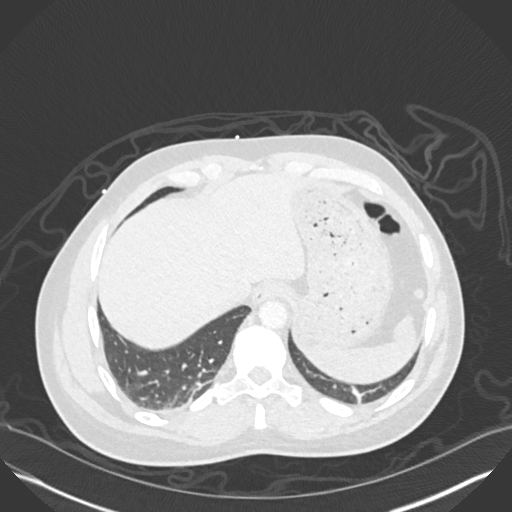
[im 28/244  mediastinal]
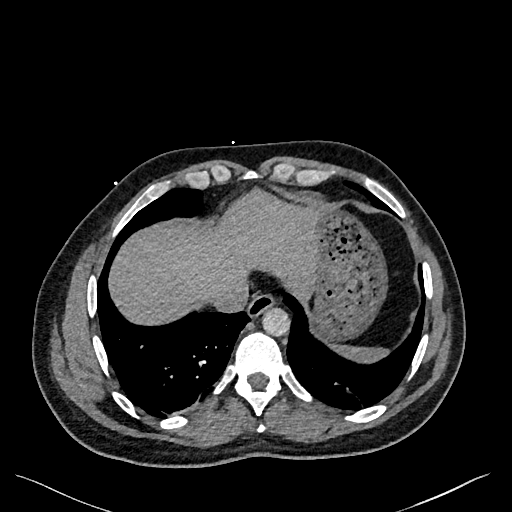
[im 41/244  lung]
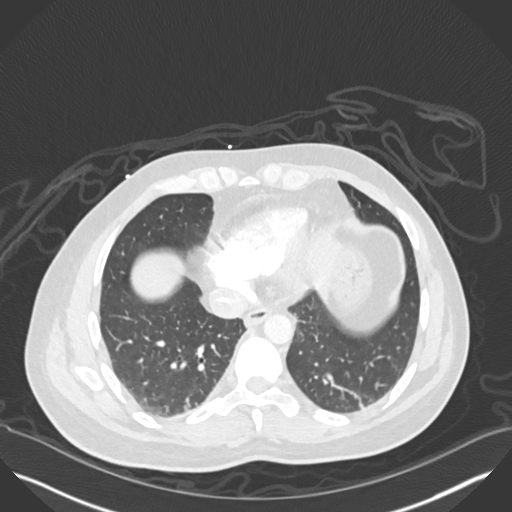
[im 55/244  mediastinal]
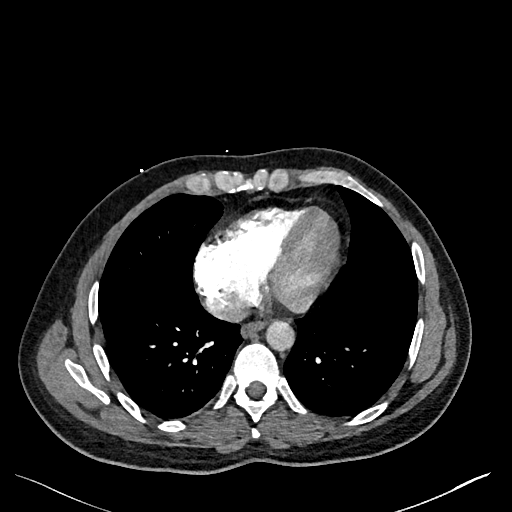
[im 68/244  lung]
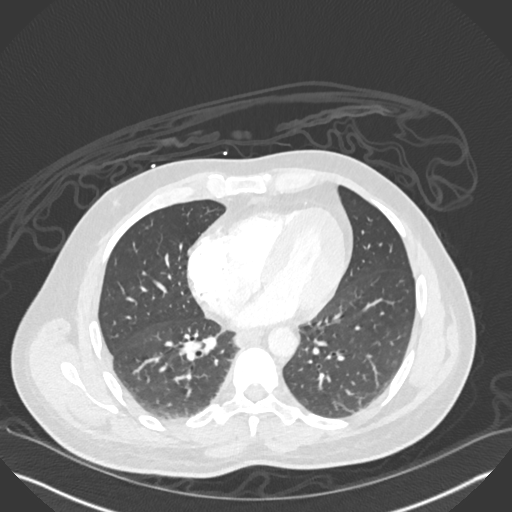
[im 82/244  mediastinal]
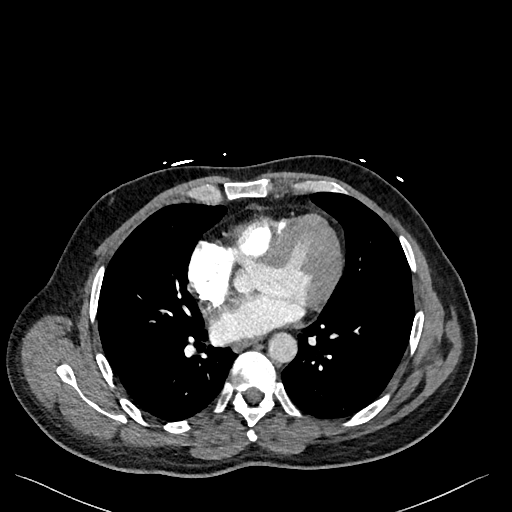
[im 95/244  lung]
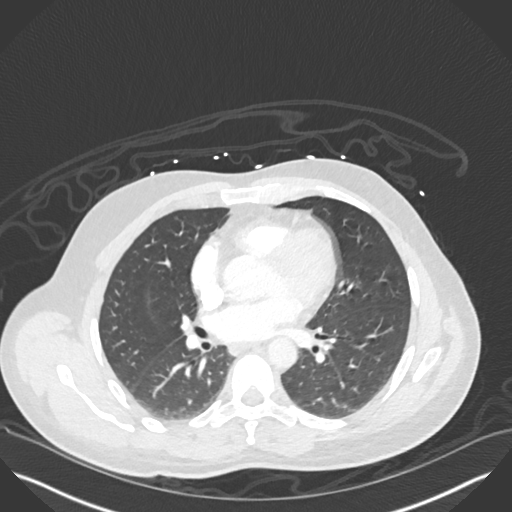
[im 109/244  mediastinal]
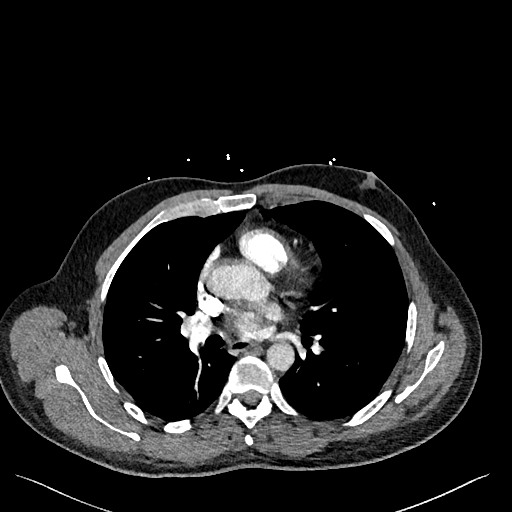
[im 122/244  lung]
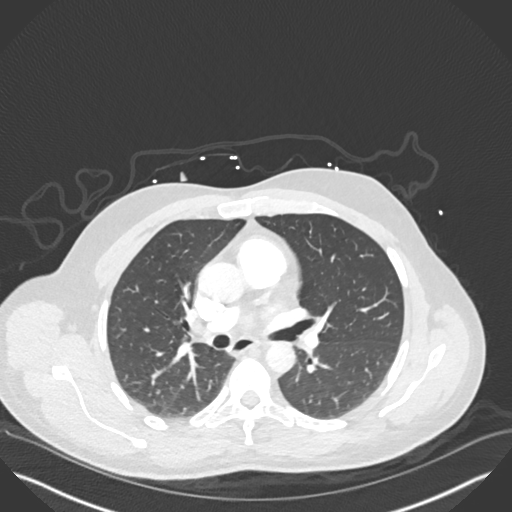
[im 136/244  mediastinal]
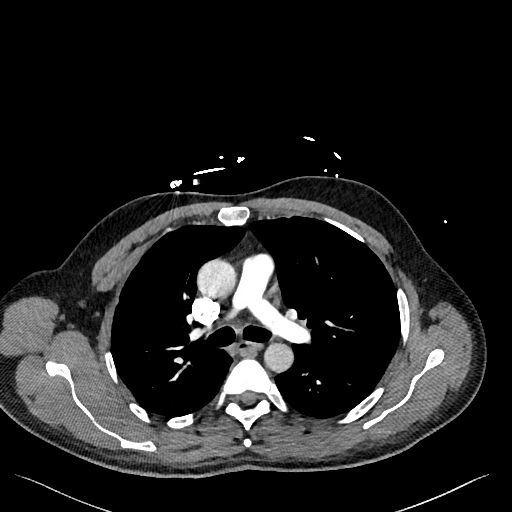
[im 149/244  lung]
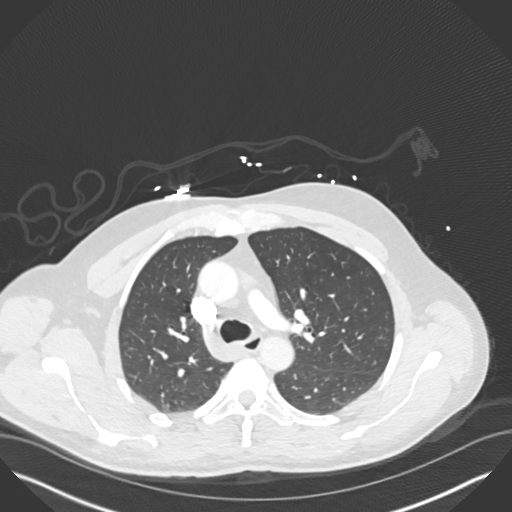
[im 163/244  mediastinal]
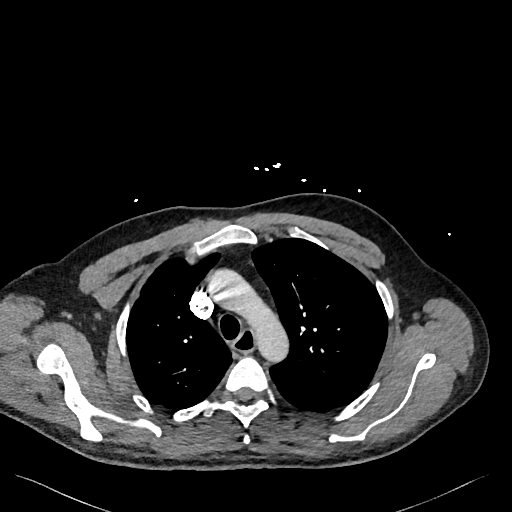
[im 176/244  lung]
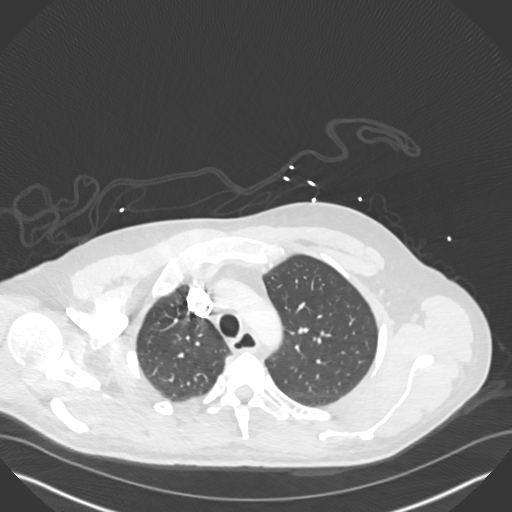
[im 190/244  mediastinal]
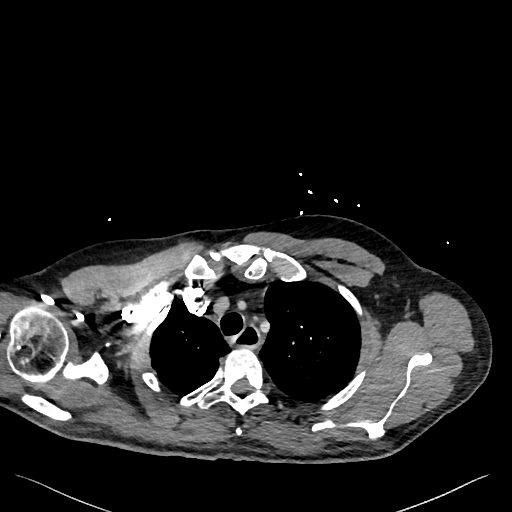
[im 203/244  lung]
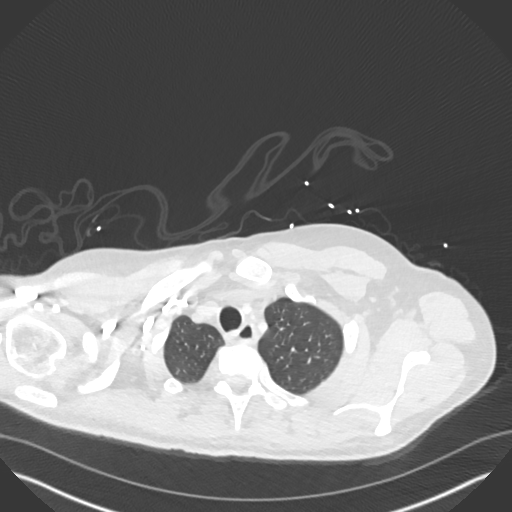
[im 217/244  mediastinal]
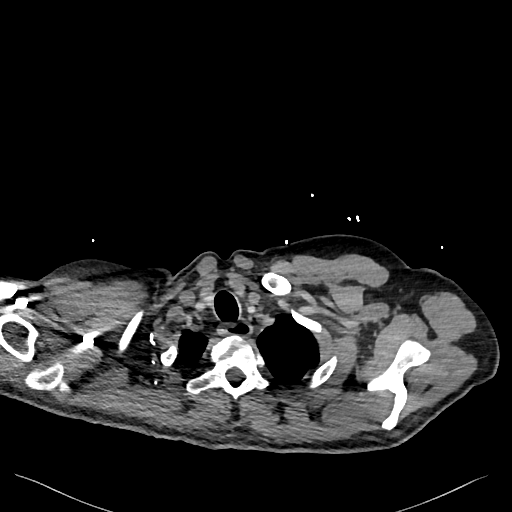
[im 230/244  lung]
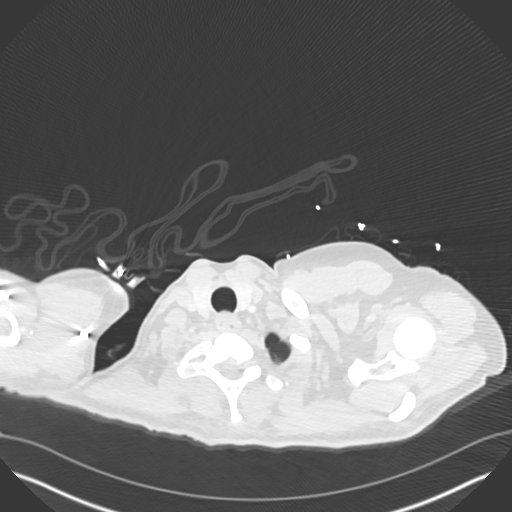

[Series 7: pe 2mm cor · coronal · 0.49mm/px · 1 of 118 slices shown]
[im 59/118  mediastinal]
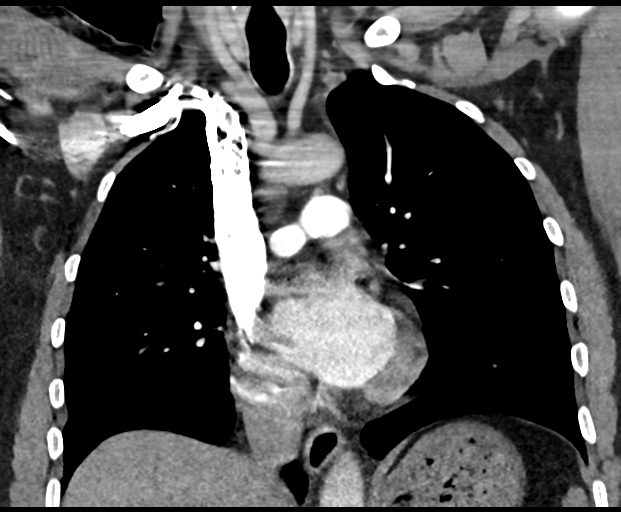

[18 of 36 positions shown; findings below may reference images not displayed]

FINDINGS: Cardiovascular: Thoracic aorta is within normal limits. Pulmonary
artery is well visualize with a normal branching pattern. No filling
defects are noted.

Mediastinum/Nodes: Thoracic inlet is within normal limits. No hilar
or mediastinal adenopathy is noted.

Lungs/Pleura: Lungs are well aerated bilaterally. Minimal bibasilar
atelectasis is noted.

Upper Abdomen: No acute abnormality.

Musculoskeletal: No chest wall abnormality. No acute or significant
osseous findings.

Review of the MIP images confirms the above findings.
IMPRESSION: No evidence of pulmonary emboli. Mild bibasilar atelectasis is
noted.

## 2018-01-09 ENCOUNTER — Ambulatory Visit
Payer: BC Managed Care – PPO | Attending: Internal Medicine | Admitting: Rehabilitative and Restorative Service Providers"

## 2018-01-09 ENCOUNTER — Encounter: Payer: Self-pay | Admitting: Rehabilitative and Restorative Service Providers"

## 2018-01-09 DIAGNOSIS — M79642 Pain in left hand: Secondary | ICD-10-CM | POA: Diagnosis present

## 2018-01-09 DIAGNOSIS — M6281 Muscle weakness (generalized): Secondary | ICD-10-CM | POA: Diagnosis present

## 2018-01-09 DIAGNOSIS — R29818 Other symptoms and signs involving the nervous system: Secondary | ICD-10-CM | POA: Insufficient documentation

## 2018-01-09 DIAGNOSIS — R2689 Other abnormalities of gait and mobility: Secondary | ICD-10-CM | POA: Insufficient documentation

## 2018-01-09 DIAGNOSIS — M79641 Pain in right hand: Secondary | ICD-10-CM | POA: Insufficient documentation

## 2018-01-09 NOTE — Therapy (Signed)
[facility] [address] [place], [place], [address] Phone: [phone]   Fax:  [phone]  Physical Therapy Evaluation  Patient Details  Name: Daniel Morales MRN: [id number] Date of Birth: Mar 26, 1970 Referring Provider (PT): Jerene Dilling, MD   Encounter Date: 01/09/2018  PT End of Session - 01/09/18 2113    Visit Number  1    Number of Visits  17   eval + 16 visits   Date for PT Re-Evaluation  03/10/18    Authorization Type  BCBS State/ awaiting further insurance notes in epic    PT Start Time  1405    PT Stop Time  1455    PT Time Calculation (min)  50 min    Equipment Utilized During Treatment  Gait belt    Activity Tolerance  Patient tolerated treatment well;Patient limited by fatigue    Behavior During Therapy  WFL for tasks assessed/performed       Past Medical History:  Diagnosis Date  . Constipation   . Environmental allergies    year round  . GERD (gastroesophageal reflux disease)    "heartburn at times"  . Headache(784.0)   . Shortness of breath    "bc of my allergies"    Past Surgical History:  Procedure Laterality Date  . CHOLECYSTECTOMY  04/21/2011   Procedure: LAPAROSCOPIC CHOLECYSTECTOMY WITH INTRAOPERATIVE CHOLANGIOGRAM;  Surgeon: Valarie Merino, MD;  Location: [facility];  Service: General;  Laterality: N/A;  . CIRCUMCISION    . ERCP  04/20/2011   Procedure: ENDOSCOPIC RETROGRADE CHOLANGIOPANCREATOGRAPHY (ERCP);  Surgeon: Petra Kuba, MD;  Location: [facility];  Service: Endoscopy;  Laterality: N/A;    There were no vitals filed for this visit.   Subjective Assessment - 01/09/18 1410    Subjective  "My body is weak and I'm tired all of the time."  The patient notes sympotms began after hospitalization for B12 deficiency.  He notes he had symptoms of numbness in his hands and feet x 5 years.  He notes it does affect cognition/ memory.   He has been diagnosed with MS.     Patient is accompained by:  Family member    Pertinent History  vitamin B12 deficiency, MS, macrocytic anemia    Patient Stated Goals  "Strengthening my legs";  fatigue- patient is sleeping off and on all day.   Takes a long time to get ready and is limited by fatigue.    Currently in Pain?  Yes    Pain Score  --   mild headache today   Pain Radiating Towards  Burning sensation inhis legs, numbness in bottom of the feet, general heaviness in his legs.      Pain Onset  More than a month ago    Pain Frequency  Intermittent    Aggravating Factors   Burning and stiffness is always present; nothing aggravates    Pain Relieving Factors  nothing         [facility] PT Assessment - 01/09/18 1415      Assessment   Medical Diagnosis  Multiple Sclerosis    Referring Provider (PT)  Jerene Dilling, MD    Onset Date/Surgical Date  12/13/17    Hand Dominance  Right    Prior Therapy  prior PT in the hospital, had some HH PT and then went to aquatic therapy-- got too tired to make the appts some day.      Precautions   Precautions  Fall    Precaution  Comments  uses a cane "I try to be careful"      Restrictions   Weight Bearing Restrictions  No      Balance Screen   Has the patient fallen in the past 6 months  No    Has the patient had a decrease in activity level because of a fear of falling?   Yes   due to fatigue   Is the patient reluctant to leave their home because of a fear of falling?   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; on disability    Leisure  get out in the community; socialize      Cognition   Overall Cognitive Status  Impaired/Different from baseline    Memory  --   notes impairments in memory     Sensation   Light Touch  Impaired Detail    Light Touch Impaired Details  Impaired RUE;Impaired LUE;Impaired RLE;Impaired LLE   diminished in hands/feet   Hot/Cold  --   can feel hot/cold on back of his hand     ROM / Strength   AROM / PROM / Strength  AROM;Strength      AROM   Overall AROM   Within functional limits for tasks performed    AROM Assessment Site  --    Right/Left Shoulder  --      Strength   Strength Assessment Site  Shoulder;Elbow;Hip;Knee;Ankle    Right/Left Shoulder  Right;Left    Right Shoulder Flexion  4/5    Right Shoulder ABduction  4/5    Left Shoulder Flexion  4/5    Left Shoulder ABduction  4/5    Right/Left Elbow  Right;Left    Right Elbow Flexion  5/5    Right Elbow Extension  5/5    Right/Left Hip  Right;Left    Right Hip Flexion  3/5    Left Hip Flexion  3/5    Right/Left Knee  Right;Left    Right Knee Flexion  5/5    Right Knee Extension  4/5    Left Knee Flexion  5/5    Left Knee Extension  4/5    Right/Left Ankle  Right;Left    Right Ankle Dorsiflexion  5/5    Left Ankle Dorsiflexion  5/5      Ambulation/Gait   Ambulation/Gait  Yes    Ambulation/Gait Assistance  6: Modified independent (Device/Increase time)    Ambulation Distance (Feet)  200 Feet    Assistive device  Straight cane    Gait Pattern  Narrow base of support   slowed speed   Ambulation Surface  Level;Indoor    Gait velocity  1.55 ft/sec    Stairs  Yes    Stairs Assistance  6: Modified independent (Device/Increase time)    Stair Management Technique  Two rails;Alternating pattern  Number of Stairs  4      Standardized Balance Assessment   Standardized Balance Assessment  Berg Balance Test      Berg Balance Test   Sit to Stand  Able to stand  independently  using hands    Standing Unsupported  Able to stand safely 2 minutes    Sitting with Back Unsupported but Feet Supported on Floor or Stool  Able to sit safely and securely 2 minutes    Stand to Sit  Sits safely with minimal use of hands    Transfers  Able to transfer safely, minor use of hands    Standing Unsupported with Eyes Closed  Able to stand 10 seconds with supervision    Standing Ubsupported with Feet Together  Able to place feet together independently and stand for 1 minute with supervision    From Standing, Reach Forward with Outstretched Arm  Can reach forward >12 cm safely (5")    From Standing Position, Pick up Object from Floor  Able to pick up shoe safely and easily    From Standing Position, Turn to Look Behind Over each Shoulder  Looks behind from both sides and weight shifts well    Turn 360 Degrees  Needs close supervision or verbal cueing    Standing Unsupported, Alternately Place Feet on Step/Stool  Able to complete 4 steps without aid or supervision    Standing Unsupported, One Foot in Front  Able to plae foot ahead of the other independently and hold 30 seconds    Standing on One Leg  Tries to lift leg/unable to hold 3 seconds but remains standing independently    Total Score  43                Objective measurements completed on examination: See above findings.      [facility] Adult PT Treatment/Exercise - 01/09/18 1415      Self-Care   Self-Care  Other Self-Care Comments    Other Self-Care Comments   Discussed fatigue and MS,  provided handout from MS society on fatigue.  Discussed getting into a sleep routine at home so he sleeps less during the day to begin to reduce fatigue.             PT Education - 01/09/18 2109    Education Details  MS society information on fatigue; discussion of plan of care    Person(s) Educated  Patient;Spouse    Methods  Explanation;Handout    Comprehension  Verbalized understanding       PT Short Term Goals -  01/09/18 2114      PT SHORT TERM GOAL #1   Title  The patient will verbalize understanding of energy conservation techniques due to reports of fatigue limiting daily tasks.    Time  4    Period  Weeks    Target Date  02/08/18      PT SHORT TERM GOAL #2   Title  The patient will improve Berg from 43/56 to > or equal to 48/56 to demo dec'ing risk for falls.    Time  4    Period  Weeks    Target Date  02/08/18      PT SHORT TERM GOAL #3   Title  The patient will improve gait speed from 1.55 ft/sec to > or equal to 2.0 ft/sec to demo dec'ing risk for falls.    Time  4    Period  Weeks    Target Date  02/08/18      PT SHORT TERM GOAL #4   Title  The patient will ambulate nonstop x 500 ft with least restrictive device mod indep in order to perform increasing community ambulation.    Time  4    Period  Weeks    Target Date  02/08/18      PT SHORT TERM GOAL #5   Title  The patient will move floor<>stand with UE support mod indep.    Time  4    Period  Weeks    Target Date  02/08/18        PT Long Term Goals - 01/09/18 2116      PT LONG TERM GOAL #1   Title  The patient will be indep with HEP for LE strengthening, flexibility, core stability and general conditioning.    Time  8    Period  Weeks    Target Date  03/10/18      PT LONG TERM GOAL #2   Title  The patient will improve gait speed from 1.55 ft/sec to > or equal to 2.5 ft/sec to demo improving mobility for community ambulation.    Time  8    Period  Weeks    Target Date  03/10/18      PT LONG TERM GOAL #3   Title   The patient will improve Berg balance score from 43/56 to > or equal to 50/56 to demo dec'ing risk for falls.    Time  8    Period  Weeks    Target Date  03/10/18      PT LONG TERM GOAL #4   Title  The patient will negotiate 12 steps without a handrail and reciprocal pattern independently.    Time  8    Period  Weeks    Target Date  03/10/18      PT LONG TERM GOAL #5   Title  The patient will  negotiate community surfaces without a device independently x 1500 ft nonstop including grass, ramps, curbs to demo return to full community mobility.    Time  8    Period  Weeks    Target Date  03/10/18             Plan - 01/09/18 2129    Clinical Impression Statement  The patient is a 47 yo male presenting to OP physical therapy with diagnosis of MS with h/o B12 deficiency and anemia.  He presents with impairments in hip flexor and quad strength, balance, gait, and activity tolerance.  The patient is limited in ability to perform household activities, community mobility and ability to work (applying for long term disability).  PT to address impairments in order to optimize current functional status.    History and Personal Factors relevant to plan of care:  vitamin B12 deficiency, MS, macrocytic anemia, inability to work, sleep disturbance per reports    Clinical Presentation  Stable    Clinical Decision Making  Low    Rehab Potential  Good    PT Frequency  2x / week   eval +   PT Duration  8 weeks    PT Treatment/Interventions  ADLs/Self Care Home Management;Balance training;Neuromuscular re-education;Patient/family education;Gait training;Stair training;Functional mobility training;Therapeutic activities;Therapeutic exercise;Manual techniques    PT Next Visit Plan  Establish HEP for hip flexor strengthening, quad strengthening, general stretching.  Provide MS and exercise handout from [facility] MS society.  Add home walking program beginning at 3-5 minutes 3x/day to begin  to slowly increase activity tolerance.      Consulted and Agree with Plan of Care  Patient       Patient will benefit from skilled therapeutic intervention in order to improve the following deficits and impairments:  Abnormal gait, Decreased endurance, Impaired sensation, Decreased activity tolerance, Decreased balance, Decreased mobility, Pain, Decreased strength  Visit Diagnosis: Muscle weakness  (generalized)  Other abnormalities of gait and mobility  Other symptoms and signs involving the nervous system     Problem List Patient Active Problem List   Diagnosis Date Noted  . Influenza B 04/09/2016  . Demyelinating disease (HCC)   . Anemia due to vitamin B12 deficiency   . 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 Multiple sclerosis (HCC) 03/09/2016  . Constipation 03/09/2016  . S/P laparoscopic cholecystectomy & ERCP March 2013 05/06/2011  . Symptomatic cholelithiasis 04/20/2011  . Choledocholithiasis 04/20/2011    Shalissa Easterwood, PT 01/09/2018, 9:53 PM  [facility] [address] [place], [place], [address] Phone: [phone]   Fax:  [phone]  Name: Lyman Balingit MRN: [id number] Date of Birth: Nov 08, 1970

## 2018-01-11 ENCOUNTER — Ambulatory Visit: Payer: BC Managed Care – PPO | Admitting: Occupational Therapy

## 2018-01-11 DIAGNOSIS — M6281 Muscle weakness (generalized): Secondary | ICD-10-CM

## 2018-01-11 DIAGNOSIS — R29818 Other symptoms and signs involving the nervous system: Secondary | ICD-10-CM

## 2018-01-11 DIAGNOSIS — M79641 Pain in right hand: Secondary | ICD-10-CM

## 2018-01-11 DIAGNOSIS — M79642 Pain in left hand: Secondary | ICD-10-CM

## 2018-01-11 DIAGNOSIS — R2689 Other abnormalities of gait and mobility: Secondary | ICD-10-CM

## 2018-01-12 ENCOUNTER — Encounter: Payer: Self-pay | Admitting: Occupational Therapy

## 2018-01-12 NOTE — Therapy (Signed)
[facility] [address] [place], [place], [address] Phone: [phone]   Fax:  [phone]  Occupational Therapy Evaluation  Patient Details  Name: Daniel Morales MRN: [id number] Date of Birth: [birth year] Referring Provider (OT): Dr. Nicholos Johns   Encounter Date: 01/11/2018  OT End of Session - 01/12/18 1416    Visit Number  1    Number of Visits  9    Date for OT Re-Evaluation  03/13/18    Authorization Type  state BCBS    OT Start Time  1405    OT Stop Time  1445    OT Time Calculation (min)  40 min    Behavior During Therapy  [facility] for tasks assessed/performed       Past Medical History:  Diagnosis Date  . Constipation   . Environmental allergies    year round  . GERD (gastroesophageal reflux disease)    "heartburn at times"  . Headache(784.0)   . Shortness of breath    "bc of my allergies"    Past Surgical History:  Procedure Laterality Date  . CHOLECYSTECTOMY  04/21/2011   Procedure: LAPAROSCOPIC CHOLECYSTECTOMY WITH INTRAOPERATIVE CHOLANGIOGRAM;  Surgeon: Valarie Merino, MD;  Location: [facility];  Service: General;  Laterality: N/A;  . CIRCUMCISION    . ERCP  04/20/2011   Procedure: ENDOSCOPIC RETROGRADE CHOLANGIOPANCREATOGRAPHY (ERCP);  Surgeon: Petra Kuba, MD;  Location: [facility];  Service: Endoscopy;  Laterality: N/A;    There were no vitals filed for this visit.  Subjective Assessment - 01/11/18 1409    Patient Stated Goals  to get rid of MS symptoms    Currently in Pain?  Yes    Pain Score  5     Pain Location  Hand   and feet   Pain Orientation  Right;Left    Pain Descriptors / Indicators  Aching;Burning    Pain Onset  More than a month ago    Pain Frequency  Intermittent    Aggravating Factors   Burning and stiffness    Pain Relieving Factors  nothing    Multiple Pain Sites  No        [facility] OT Assessment - 01/12/18 0001      Assessment   Medical Diagnosis  Multiple  Sclerosis    Referring Provider (OT)  Dr. Nicholos Johns    Onset Date/Surgical Date  12/13/17      Precautions   Precautions  Fall      Balance Screen   Has the patient fallen in the past 6 months  No    Has the patient had a decrease in activity level because of a fear of falling?   No    Is the patient reluctant to leave their home because of a fear of falling?   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; on disability    Leisure  get out in the community; socialize      ADL   Eating/Feeding  Independent    Grooming  Independent    Upper Body Bathing  Modified independent   standing   Lower Body Bathing  Modified independent    Upper Body Dressing  Independent    Lower  Body Dressing  Increased time   modified independent   Toilet Transfer  Modified independent    Tub/Shower Transfer  Modified independent      IADL   Shopping  Needs to be accompanied on any shopping trip    Light Housekeeping  Performs light daily tasks such as dishwashing, bed making    Meal Prep  Able to complete simple warm meal prep    Medication Management  Has difficulty remembering to take medication      Mobility   Mobility Status  Independent   uses cane in community     Written Expression   Dominant Hand  Right    Handwriting  100% legible   increased difficulty, awkward when writing     Vision - History   Patient Visual Report  Other (comment)   blurry vision     Vision Assessment   Vision Assessment  Vision not tested      Activity Tolerance   Activity Tolerance  --   tolerates grossly 10-15 mins in standing prior to rest break     Cognition   Overall Cognitive Status  Impaired/Different from baseline    Area of Impairment  Memory    Memory  Decreased short-term memory     Attention  Selective    Memory  Impaired    Cognition Comments  Pt reports brain fog      Observation/Other Assessments   Standing Functional Reach Test  RUE 8 inches, LUE 10.5      Sensation   Light Touch  Impaired Detail    Light Touch Impaired Details  Impaired RUE;Impaired LUE;Impaired RLE;Impaired LLE    Hot/Cold  Impaired by gross assessment   bilateral UE's     Coordination   9 Hole Peg Test  Right;Left    Right 9 Hole Peg Test  25.12 secs    Left 9 Hole Peg Test  31.40 secs      ROM / Strength   AROM / PROM / Strength  AROM;Strength      AROM   Overall AROM   Within functional limits for tasks performed      Strength   Strength Assessment Site  Shoulder;Elbow    Right Shoulder Flexion  4/5    Left Shoulder Flexion  4/5    Right Elbow Flexion  5/5    Right Elbow Extension  5/5    Left Elbow Flexion  5/5    Left Elbow Extension  5/5      Hand Function   Right Hand Grip (lbs)  103 lbs    Left Hand Grip (lbs)  90 lbs                        OT Short Term Goals - 01/12/18 1423      OT SHORT TERM GOAL #1   Title  I with HEP    Time  4    Period  Weeks    Status  New    Target Date  03/13/18      OT SHORT TERM GOAL #2   Title  Pt will verbalize understanding of energy conservation techniques for ADLS/IADLS    Time  4    Period  Weeks    Status  New      OT SHORT TERM GOAL #3   Title  Pt will demonstrate ability to perform home management/ functional activities in standing x 20 mins without rest break  Time  4    Period  Weeks    Status  New        OT Long Term Goals - 01/12/18 1425      OT LONG TERM GOAL #1   Title  Pt will verbalize understanding of compensatory strategies for cognitive and  short term memory deficits.    Time  8    Period  Weeks    Status  New    Target Date  03/13/18      OT LONG TERM GOAL #2   Title  Pt will perform mod complex home management and cooking at a modified independent level demonstrating good  safety awareness.    Time  8    Period  Weeks    Status  New      OT LONG TERM GOAL #3   Title  Pt will verbalize understanding of MS community resources.    Time  8    Period  Weeks    Status  New            Plan - 01/12/18 1417    Clinical Impression Statement  47 yo male presenting to OPOT with diagnosis of MS with h/o B12 deficiency and anemia. Pt presents with the following deficits: decreased strength, hand pain, decreased activity tolerance, decreased balance and functional mobility, cognitive deficits which impede perfromance of ADLS/IADLs. Pt can benefit from skilled occupational therapy to maximize pt safety and indpendence with daily activities.    Occupational Profile and client history currently impacting functional performance  The patient is limited in ability to perform household activities, community mobility and ability to work  PMH: MS, vitamin B12 deficiency, MS, macrocytic anemia, inability to work, sleep disturbance per reports ty).     Occupational performance deficits (Please refer to evaluation for details):  ADL's;IADL's;Rest and Sleep;Leisure;Social Participation    Rehab Potential  Fair    Current Impairments/barriers affecting progress:  depression, poor activity tolerance,     OT Frequency  1x / week   plus eval   OT Duration  8 weeks    OT Treatment/Interventions  Self-care/ADL training;Therapeutic exercise;Visual/perceptual remediation/compensation;Patient/family education;Splinting;Neuromuscular education;Energy conservation;Building services engineer;Therapeutic activities;Balance training;Passive range of motion;Manual Therapy;DME and/or AE instruction;Ultrasound;Cryotherapy    Plan  initiate HEP, energy conservation, educate regarding MS resources    Clinical Decision Making  Limited treatment options, no task modification necessary    Consulted and Agree with Plan of Care  Patient       Patient will benefit from skilled therapeutic intervention  in order to improve the following deficits and impairments:  Abnormal gait, Decreased cognition, Decreased knowledge of use of DME, Pain, Decreased mobility, Decreased coordination, Impaired sensation, Decreased activity tolerance, Decreased endurance, Decreased strength, Impaired UE functional use, Impaired perceived functional ability, Difficulty walking, Decreased safety awareness, Decreased knowledge of precautions, Decreased balance  Visit Diagnosis: Muscle weakness (generalized)  Other abnormalities of gait and mobility  Other symptoms and signs involving the nervous system  Pain in left hand  Pain in right hand    Problem List Patient Active Problem List   Diagnosis Date Noted  . Influenza B 04/09/2016  . Demyelinating disease (HCC)   . Anemia due to vitamin B12 deficiency   . 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 Multiple sclerosis (HCC) 03/09/2016  . Constipation 03/09/2016  . S/P laparoscopic cholecystectomy & ERCP March 2013 05/06/2011  . Symptomatic cholelithiasis 04/20/2011  . Choledocholithiasis 04/20/2011    RINE,KATHRYN 01/12/2018, 2:39 PM [facility], OTR/L [phone] Phone: [phone] 2:42 PM 01/12/18 [facility] [address] [place], [place], [address] Phone: [phone]   Fax:  [phone]  Name: Daniel Morales MRN: [id number] Date of Birth: 03/20/1970

## 2018-01-27 ENCOUNTER — Encounter: Payer: Self-pay | Admitting: Rehabilitative and Restorative Service Providers"

## 2018-01-27 ENCOUNTER — Ambulatory Visit: Payer: BC Managed Care – PPO | Admitting: Rehabilitative and Restorative Service Providers"

## 2018-01-27 DIAGNOSIS — R2689 Other abnormalities of gait and mobility: Secondary | ICD-10-CM

## 2018-01-27 DIAGNOSIS — M6281 Muscle weakness (generalized): Secondary | ICD-10-CM

## 2018-01-27 DIAGNOSIS — R29818 Other symptoms and signs involving the nervous system: Secondary | ICD-10-CM

## 2018-01-27 NOTE — Therapy (Signed)
[facility] [address] [place], [place], [address] Phone: [phone]   Fax:  [phone]  Physical Therapy Treatment  Patient Details  Name: Daniel Morales MRN: [id number] Date of Birth: 05/07/1970 Referring Provider (PT): Jerene DillingAjith Ramachandra, MD   Encounter Date: 01/27/2018  PT End of Session - 01/27/18 1553    Visit Number  2    Number of Visits  17   eval + 16 visits   Date for PT Re-Evaluation  03/10/18    Authorization Type  [employer] State/ awaiting further insurance notes in epic    PT Start Time  1405    PT Stop Time  1445    PT Time Calculation (min)  40 min    Activity Tolerance  Patient tolerated treatment well;Patient limited by fatigue    Behavior During Therapy  [facility] for tasks assessed/performed       Past Medical History:  Diagnosis Date  . Constipation   . Environmental allergies    year round  . GERD (gastroesophageal reflux disease)    "heartburn at times"  . Headache(784.0)   . Shortness of breath    "bc of my allergies"    Past Surgical History:  Procedure Laterality Date  . CHOLECYSTECTOMY  04/21/2011   Procedure: LAPAROSCOPIC CHOLECYSTECTOMY WITH INTRAOPERATIVE CHOLANGIOGRAM;  Surgeon: Valarie MerinoMatthew B Martin, MD;  Location: [facility];  Service: General;  Laterality: N/A;  . CIRCUMCISION    . ERCP  04/20/2011   Procedure: ENDOSCOPIC RETROGRADE CHOLANGIOPANCREATOGRAPHY (ERCP);  Surgeon: Petra KubaMarc E Magod, MD;  [facility];  Service: Endoscopy;  Laterality: N/A;    There were no vitals filed for this visit.  Subjective Assessment - 01/27/18 1408    Subjective  The patient notes no changes since evaluation.  He reports burning in legs.     Patient is accompained by:  Family member    Pertinent History  vitamin B12 deficiency, MS, macrocytic anemia    Patient Stated Goals  "Strengthening my legs";  fatigue- patient is sleeping off and on all day.   Takes a long time to get ready and is  limited by fatigue.    Currently in Pain?  Yes    Pain Location  Leg    Pain Orientation  Right;Left    Pain Descriptors / Indicators  Burning;Aching    Pain Onset  More than a month ago    Pain Frequency  Intermittent    Aggravating Factors   burning sensation    Pain Relieving Factors  unsure                       [facility] Adult PT Treatment/Exercise - 01/27/18 1409      Ambulation/Gait   Ambulation/Gait  Yes    Ambulation/Gait Assistance  6: Modified independent (Device/Increase time)    Ambulation Distance (Feet)  350 Feet    Assistive device  Straight cane   with cues on correct pattern   Ambulation Surface  Level;Indoor    Gait Comments  Initiated walking program x 3 min, 45 seconds.  Recommended patient start at 3-5 minutes at home.      Self-Care   Self-Care  Other Self-Care Comments    Other Self-Care Comments   PT, patient and patient's spouse discussed the "MS and exercise" handout from [employer].  We reviewed tips to fight fatigue and also discussed the patient's home schedule.  PT recommended moving time out of bed from 6pm to 5:45pm  this week (he is used to being up at night from prior work schedule).      Exercises   Exercises  Other Exercises    Other Exercises   STRETCHES:  hip flexor stretch, seated hamstring stretching bilaterally, gastrocs stretch.   STANDING:  mini squats x 6 reps, sit<>stand x 10 reps without UE support.    Patient fatigued with these exercises.               PT Education - 01/27/18 1558    Education Details  Provided medbridge HEP (unsure of access code):  hamstring seated stretch, hip flexor stretch, gastrocs stretch.  Provided home walking program    Person(s) Educated  Patient;Spouse    Methods  Explanation;Demonstration;Handout    Comprehension  Returned demonstration;Verbalized understanding       PT Short Term Goals - 01/09/18 2114      PT SHORT TERM GOAL #1   Title  The patient will verbalize  understanding of energy conservation techniques due to reports of fatigue limiting daily tasks.    Time  4    Period  Weeks    Target Date  02/08/18      PT SHORT TERM GOAL #2   Title  The patient will improve Berg from 43/56 to > or equal to 48/56 to demo dec'ing risk for falls.    Time  4    Period  Weeks    Target Date  02/08/18      PT SHORT TERM GOAL #3   Title  The patient will improve gait speed from 1.55 ft/sec to > or equal to 2.0 ft/sec to demo dec'ing risk for falls.    Time  4    Period  Weeks    Target Date  02/08/18      PT SHORT TERM GOAL #4   Title  The patient will ambulate nonstop x 500 ft with least restrictive device mod indep in order to perform increasing community ambulation.    Time  4    Period  Weeks    Target Date  02/08/18      PT SHORT TERM GOAL #5   Title  The patient will move floor<>stand with UE support mod indep.    Time  4    Period  Weeks    Target Date  02/08/18        PT Long Term Goals - 01/09/18 2116      PT LONG TERM GOAL #1   Title  The patient will be indep with HEP for LE strengthening, flexibility, core stability and general conditioning.    Time  8    Period  Weeks    Target Date  03/10/18      PT LONG TERM GOAL #2   Title  The patient will improve gait speed from 1.55 ft/sec to > or equal to 2.5 ft/sec to demo improving mobility for community ambulation.    Time  8    Period  Weeks    Target Date  03/10/18      PT LONG TERM GOAL #3   Title   The patient will improve Berg balance score from 43/56 to > or equal to 50/56 to demo dec'ing risk for falls.    Time  8    Period  Weeks    Target Date  03/10/18      PT LONG TERM GOAL #4   Title  The patient will negotiate 12 steps without a handrail  and reciprocal pattern independently.    Time  8    Period  Weeks    Target Date  03/10/18      PT LONG TERM GOAL #5   Title  The patient will negotiate community surfaces without a device independently x 1500 ft nonstop  including grass, ramps, curbs to demo return to full community mobility.    Time  8    Period  Weeks    Target Date  03/10/18            Plan - 01/27/18 1559    Clinical Impression Statement  The patient fatigues quickly today in physical therapy session.  He and his wife report fatigue creating a "wall" that causes him to shut down.  The patient's spouse discusses some of their frustration re: it taking 2 years to determine a diagnosis.  The patient and PT discussed counseling services for adjustment to disability and to work on strategies to avoid "shutting down".  PT to reach out to primary care MD to request referral for counseling.  Feel that he may make greater physical progress if emotioal stressors addressed.      PT Treatment/Interventions  ADLs/Self Care Home Management;Balance training;Neuromuscular re-education;Patient/family education;Gait training;Stair training;Functional mobility training;Therapeutic activities;Therapeutic exercise;Manual techniques    PT Next Visit Plan  Check HEP.  Add standing strengthening if tolerated (sit<>stand and mini squats).  Increase walking distance.      Consulted and Agree with Plan of Care  Patient       Patient will benefit from skilled therapeutic intervention in order to improve the following deficits and impairments:  Abnormal gait, Decreased endurance, Impaired sensation, Decreased activity tolerance, Decreased balance, Decreased mobility, Pain, Decreased strength  Visit Diagnosis: Muscle weakness (generalized)  Other abnormalities of gait and mobility  Other symptoms and signs involving the nervous system     Problem List Patient Active Problem List   Diagnosis Date Noted  . Influenza B 04/09/2016  . Demyelinating disease (HCC)   . Anemia due to vitamin B12 deficiency   . 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 Multiple sclerosis (HCC) 03/09/2016  . Constipation 03/09/2016  . S/P laparoscopic cholecystectomy & ERCP March 2013  05/06/2011  . Symptomatic cholelithiasis 04/20/2011  . Choledocholithiasis 04/20/2011    Lamaya Hyneman, PT 01/27/2018, 4:02 PM  [facility] [address] [place], [place], [address] Phone: [phone]   Fax:  [phone]  Name: Daniel Morales MRN: [id number] Date of Birth: 1970/05/17

## 2018-02-02 ENCOUNTER — Telehealth: Payer: Self-pay | Admitting: Rehabilitative and Restorative Service Providers"

## 2018-02-02 NOTE — Telephone Encounter (Signed)
Dr. Nicholos Johns, Tsutomu Pelley has been seen 3 visits in physical therapy.  At our last session on 01/27/18, we discussed the possibility of counseling services.    I documented: "The patient's spouse discusses some of their frustration re: it taking 2 years to determine a diagnosis.  The patient and PT discussed counseling services for adjustment to disability and to work on strategies to avoid "shutting down".    Please contact the patient to refer him for counseling services if you agree.  Thank you, Tully Mcinturff, PT [facility] [address] [place], [place]  [address] Phone:  [phone] Fax:  [phone]

## 2018-02-03 ENCOUNTER — Ambulatory Visit
Payer: BC Managed Care – PPO | Attending: Internal Medicine | Admitting: Rehabilitative and Restorative Service Providers"

## 2018-02-03 ENCOUNTER — Encounter: Payer: Self-pay | Admitting: Rehabilitative and Restorative Service Providers"

## 2018-02-03 DIAGNOSIS — M25572 Pain in left ankle and joints of left foot: Secondary | ICD-10-CM | POA: Insufficient documentation

## 2018-02-03 DIAGNOSIS — M6281 Muscle weakness (generalized): Secondary | ICD-10-CM | POA: Diagnosis not present

## 2018-02-03 DIAGNOSIS — R29818 Other symptoms and signs involving the nervous system: Secondary | ICD-10-CM | POA: Insufficient documentation

## 2018-02-03 DIAGNOSIS — M79642 Pain in left hand: Secondary | ICD-10-CM | POA: Insufficient documentation

## 2018-02-03 DIAGNOSIS — R2689 Other abnormalities of gait and mobility: Secondary | ICD-10-CM | POA: Insufficient documentation

## 2018-02-03 DIAGNOSIS — M79641 Pain in right hand: Secondary | ICD-10-CM | POA: Insufficient documentation

## 2018-02-03 DIAGNOSIS — M25571 Pain in right ankle and joints of right foot: Secondary | ICD-10-CM | POA: Insufficient documentation

## 2018-02-03 NOTE — Therapy (Signed)
[facility] [address] [place], [place], [address] Phone: [phone]   Fax:  [phone]  Physical Therapy Treatment  Patient Details  Name: Daniel Morales MRN: [id number] Date of Birth: 03-Jun-1970 Referring Provider (PT): Jerene Dilling, MD   Encounter Date: 02/03/2018  PT End of Session - 02/03/18 1410    Visit Number  3    Number of Visits  17   eval + 16 visits   Date for PT Re-Evaluation  03/10/18    Authorization Type  [employer] State/ awaiting further insurance notes in epic    PT Start Time  1408    PT Stop Time  1440    PT Time Calculation (min)  32 min    Activity Tolerance  Patient tolerated treatment well;Patient limited by fatigue    Behavior During Therapy  [facility] for tasks assessed/performed       Past Medical History:  Diagnosis Date  . Constipation   . Environmental allergies    year round  . GERD (gastroesophageal reflux disease)    "heartburn at times"  . Headache(784.0)   . Shortness of breath    "bc of my allergies"    Past Surgical History:  Procedure Laterality Date  . CHOLECYSTECTOMY  04/21/2011   Procedure: LAPAROSCOPIC CHOLECYSTECTOMY WITH INTRAOPERATIVE CHOLANGIOGRAM;  Surgeon: Valarie Merino, MD;  Location: [facility];  Service: General;  Laterality: N/A;  . CIRCUMCISION    . ERCP  04/20/2011   Procedure: ENDOSCOPIC RETROGRADE CHOLANGIOPANCREATOGRAPHY (ERCP);  Surgeon: Petra Kuba, MD;  [facility];  Service: Endoscopy;  Laterality: N/A;    There were no vitals filed for this visit.  Subjective Assessment - 02/03/18 1408    Subjective  The patient just woke up and is feeling tired today- "slow start".  He has shoulder soreness with sharp pain on the right side.  This pain is worse with right rotation.  The patient did the walking, but did not do the stretching exercises (gave 3).    Pertinent History  vitamin B12 deficiency, MS, macrocytic anemia    Patient Stated  Goals  "Strengthening my legs";  fatigue- patient is sleeping off and on all day.   Takes a long time to get ready and is limited by fatigue.    Currently in Pain?  Yes    Pain Score  4    4/10 for neck/ R shoulder; 5/10 for both legs   Pain Orientation  Right;Left    Pain Descriptors / Indicators  Aching;Burning    Pain Onset  More than a month ago    Pain Frequency  Intermittent    Aggravating Factors   burning sensation    Pain Relieving Factors  unsure         [facility] PT Assessment - 02/03/18 1411      Ambulation/Gait   Ambulation/Gait Assistance  6: Modified independent (Device/Increase time);5: Supervision    Gait Comments  walked 5 minutes, 15 seconds nonstop with SPC x 4 minutes and no device x 1.25 minutes, then rested/stretched and ended with walking x 115 ft without a device with supervision                   [facility] Adult PT Treatment/Exercise - 02/03/18 1411      Ambulation/Gait   Ambulation/Gait  Yes      Self-Care   Self-Care  Other Self-Care Comments    Other Self-Care Comments   patient is unsure of how many minutes  he is doing at home.      Neuro Re-ed    Neuro Re-ed Details   Standing tandem stance, tandem gait, single leg stance activities with intermittent UE support.  Sidestepping x 10 feet x 4 reps.      Exercises   Exercises  Other Exercises    Other Exercises   Supine:  Ball roll bringing knees to chest x 10 reps, legs on physioball performing leg lifts x 10 reps alternating LEs, hooklying marches x 10 reps, sidelyinghip abduction x 10 reps right and left sides, bridges with LEs on physioball x 10 reps.   STRETCHING:  standing heel cord stretch, seated hamstring stretch.               PT Short Term Goals - 01/09/18 2114      PT SHORT TERM GOAL #1   Title  The patient will verbalize understanding of energy conservation techniques due to reports of fatigue limiting daily tasks.    Time  4    Period  Weeks    Target Date  02/08/18       PT SHORT TERM GOAL #2   Title  The patient will improve Berg from 43/56 to > or equal to 48/56 to demo dec'ing risk for falls.    Time  4    Period  Weeks    Target Date  02/08/18      PT SHORT TERM GOAL #3   Title  The patient will improve gait speed from 1.55 ft/sec to > or equal to 2.0 ft/sec to demo dec'ing risk for falls.    Time  4    Period  Weeks    Target Date  02/08/18      PT SHORT TERM GOAL #4   Title  The patient will ambulate nonstop x 500 ft with least restrictive device mod indep in order to perform increasing community ambulation.    Time  4    Period  Weeks    Target Date  02/08/18      PT SHORT TERM GOAL #5   Title  The patient will move floor<>stand with UE support mod indep.    Time  4    Period  Weeks    Target Date  02/08/18        PT Long Term Goals - 01/09/18 2116      PT LONG TERM GOAL #1   Title  The patient will be indep with HEP for LE strengthening, flexibility, core stability and general conditioning.    Time  8    Period  Weeks    Target Date  03/10/18      PT LONG TERM GOAL #2   Title  The patient will improve gait speed from 1.55 ft/sec to > or equal to 2.5 ft/sec to demo improving mobility for community ambulation.    Time  8    Period  Weeks    Target Date  03/10/18      PT LONG TERM GOAL #3   Title   The patient will improve Berg balance score from 43/56 to > or equal to 50/56 to demo dec'ing risk for falls.    Time  8    Period  Weeks    Target Date  03/10/18      PT LONG TERM GOAL #4   Title  The patient will negotiate 12 steps without a handrail and reciprocal pattern independently.    Time  8  Period  Weeks    Target Date  03/10/18      PT LONG TERM GOAL #5   Title  The patient will negotiate community surfaces without a device independently x 1500 ft nonstop including grass, ramps, curbs to demo return to full community mobility.    Time  8    Period  Weeks    Target Date  03/10/18            Plan -  02/03/18 1541    Clinical Impression Statement  The patient tolerated greater activity levels today, although session ended early due to patient arriving late and then c/o fatigue.   PT continuing to progress walking distance, reduce reliance on cane and progressing exercise in therapy.      PT Treatment/Interventions  ADLs/Self Care Home Management;Balance training;Neuromuscular re-education;Patient/family education;Gait training;Stair training;Functional mobility training;Therapeutic activities;Therapeutic exercise;Manual techniques    PT Next Visit Plan  progress walking distance dec'ing reliance on SPC, standing balance and strengthening activities, endurance, stretching/general flexibility.      Consulted and Agree with Plan of Care  Patient       Patient will benefit from skilled therapeutic intervention in order to improve the following deficits and impairments:  Abnormal gait, Decreased endurance, Impaired sensation, Decreased activity tolerance, Decreased balance, Decreased mobility, Pain, Decreased strength  Visit Diagnosis: Muscle weakness (generalized)  Other abnormalities of gait and mobility  Other symptoms and signs involving the nervous system     Problem List Patient Active Problem List   Diagnosis Date Noted  . Influenza B 04/09/2016  . Demyelinating disease (HCC)   . Anemia due to vitamin B12 deficiency   . 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 Multiple sclerosis (HCC) 03/09/2016  . Constipation 03/09/2016  . S/P laparoscopic cholecystectomy & ERCP March 2013 05/06/2011  . Symptomatic cholelithiasis 04/20/2011  . Choledocholithiasis 04/20/2011    Jamirra Curnow, PT 02/03/2018, 3:45 PM  [facility] [address] [place], [place], [address] Phone: [phone]   Fax:  [phone]  Name: Daniel Morales MRN: [id number] Date of Birth: 06-06-1970

## 2018-02-06 ENCOUNTER — Encounter: Payer: Self-pay | Admitting: Physical Therapy

## 2018-02-06 ENCOUNTER — Ambulatory Visit: Payer: BC Managed Care – PPO | Admitting: Physical Therapy

## 2018-02-06 DIAGNOSIS — M6281 Muscle weakness (generalized): Secondary | ICD-10-CM | POA: Diagnosis not present

## 2018-02-06 DIAGNOSIS — R2689 Other abnormalities of gait and mobility: Secondary | ICD-10-CM

## 2018-02-06 DIAGNOSIS — R29818 Other symptoms and signs involving the nervous system: Secondary | ICD-10-CM

## 2018-02-06 NOTE — Therapy (Signed)
[facility] [address] [place], [place], [address] Phone: [phone]   Fax:  [phone]  Physical Therapy Treatment  Patient Details  Name: Daniel Morales MRN: [id number] Date of Birth: 04/08/1970 Referring Provider (PT): Jerene Dilling, MD   Encounter Date: 02/06/2018  PT End of Session - 02/06/18 1410    Visit Number  4    Number of Visits  17   eval + 16 visits   Date for PT Re-Evaluation  03/10/18    Authorization Type  BCBS State/ awaiting further insurance notes in epic    PT Start Time  1410   late arrival   PT Stop Time  1445    PT Time Calculation (min)  35 min    Activity Tolerance  Patient tolerated treatment well;Patient limited by fatigue    Behavior During Therapy  [facility] for tasks assessed/performed       Past Medical History:  Diagnosis Date  . Constipation   . Environmental allergies    year round  . GERD (gastroesophageal reflux disease)    "heartburn at times"  . Headache(784.0)   . Shortness of breath    "bc of my allergies"    Past Surgical History:  Procedure Laterality Date  . CHOLECYSTECTOMY  04/21/2011   Procedure: LAPAROSCOPIC CHOLECYSTECTOMY WITH INTRAOPERATIVE CHOLANGIOGRAM;  Surgeon: Valarie Merino, MD;  Location: [facility];  Service: General;  Laterality: N/A;  . CIRCUMCISION    . ERCP  04/20/2011   Procedure: ENDOSCOPIC RETROGRADE CHOLANGIOPANCREATOGRAPHY (ERCP);  Surgeon: Petra Kuba, MD;  Location: [facility];  Service: Endoscopy;  Laterality: N/A;    There were no vitals filed for this visit.  Subjective Assessment - 02/06/18 1700    Subjective  Patient again reports fatigue. States he has joined several online/social media support groups and seems everyone with MS talks about being fatigued. "There doesn't seem to be any medication you can take for it."    Pertinent History  vitamin B12 deficiency, MS, macrocytic anemia    Patient Stated Goals  "Strengthening  my legs";  fatigue- patient is sleeping off and on all day.   Takes a long time to get ready and is limited by fatigue.    Currently in Pain?  No/denies         Treatment- Ther-ex--pt ambulated x3 minutes without device with minguard assist (for safety, pt with no LOB) as warm-up prior to stretching. Provided PROM/stretch to bil hamstrings with pt in supine x 30 sec each leg; bil plantarflexors x 30 sec. Patient not able to verbalize the HEP for stretching he has been issued. Discussed while providing PROM and rationale. Patient able to verbalize he should hold each stretch x 30 seconds and re-educated re: mild stretch.  Balance/strengthening- SLS in // bars with ipsilateral UE support progressing to no support with hips/pelvis level up to 20 seconds bil. Rockerboard with EO, head turns, EC, pressing anterior (and then posterior) edge of board to the floor with 5 second hold for PF, DF, and eliciting hip strategy, core strengthening. Blue foam beam crossswise with EO, head turns, EC, step off forwards and back on alternating LEs with balance recovery between reps, squats x 10                      PT Education - 02/06/18 2028    Education Details  will not add to HEP as he is not performing the 3 stretches/exercises he has been given; Fatigue  can improve with incr activity; encouraged him to set a routine for getting up and doing some kind of activity every hour; educated on availability of MS water fitness classes 3 days/week    Person(s) Educated  Patient    Methods  Explanation    Comprehension  Verbalized understanding;Need further instruction       PT Short Term Goals - 02/06/18 2033      PT SHORT TERM GOAL #1   Title  The patient will verbalize understanding of energy conservation techniques due to reports of fatigue limiting daily tasks. Target all STGs 02/17/18-extended due to missed 1 week    Time  4    Period  Weeks      PT SHORT TERM GOAL #2   Title  The  patient will improve Berg from 43/56 to > or equal to 48/56 to demo dec'ing risk for falls.    Time  4    Period  Weeks      PT SHORT TERM GOAL #3   Title  The patient will improve gait speed from 1.55 ft/sec to > or equal to 2.0 ft/sec to demo dec'ing risk for falls.    Time  4    Period  Weeks      PT SHORT TERM GOAL #4   Title  The patient will ambulate nonstop x 500 ft with least restrictive device mod indep in order to perform increasing community ambulation.    Time  4    Period  Weeks      PT SHORT TERM GOAL #5   Title  The patient will move floor<>stand with UE support mod indep.    Time  4    Period  Weeks        PT Long Term Goals - 02/06/18 2035      PT LONG TERM GOAL #1   Title  The patient will be indep with HEP for LE strengthening, flexibility, core stability and general conditioning. Target date all LTGs 03/17/18-extended due to missed one week    Time  8    Period  Weeks      PT LONG TERM GOAL #2   Title  The patient will improve gait speed from 1.55 ft/sec to > or equal to 2.5 ft/sec to demo improving mobility for community ambulation.    Time  8    Period  Weeks      PT LONG TERM GOAL #3   Title   The patient will improve Berg balance score from 43/56 to > or equal to 50/56 to demo dec'ing risk for falls.    Time  8    Period  Weeks      PT LONG TERM GOAL #4   Title  The patient will negotiate 12 steps without a handrail and reciprocal pattern independently.    Time  8    Period  Weeks      PT LONG TERM GOAL #5   Title  The patient will negotiate community surfaces without a device independently x 1500 ft nonstop including grass, ramps, curbs to demo return to full community mobility.    Time  8    Period  Weeks            Plan - 02/06/18 2036    Clinical Impression Statement  Session began late due to late arrival. Patient performed very well with balance activities, demonstrating good core strength and recovery of balance via hip and ankle  strategies. Discussed  importance and role of incr activity in general (and specifically completing HEP). Date for STGs and LTGs extended by 1 week each due to pt missed one entire week. Can continue to benefit from PT.     PT Frequency  2x / week    PT Duration  8 weeks    PT Treatment/Interventions  ADLs/Self Care Home Management;Balance training;Neuromuscular re-education;Patient/family education;Gait training;Stair training;Functional mobility training;Therapeutic activities;Therapeutic exercise;Manual techniques    PT Next Visit Plan  ?has he done stretches/HEP? progress walking distance dec'ing reliance on SPC, standing balance and strengthening activities, endurance, stretching/general flexibility.     Consulted and Agree with Plan of Care  Patient       Patient will benefit from skilled therapeutic intervention in order to improve the following deficits and impairments:  Abnormal gait, Decreased endurance, Impaired sensation, Decreased activity tolerance, Decreased balance, Decreased mobility, Pain, Decreased strength  Visit Diagnosis: Muscle weakness (generalized)  Other abnormalities of gait and mobility  Other symptoms and signs involving the nervous system     Problem List Patient Active Problem List   Diagnosis Date Noted  . Influenza B 04/09/2016  . Demyelinating disease (HCC)   . Anemia due to vitamin B12 deficiency   . 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 Multiple sclerosis (HCC) 03/09/2016  . Constipation 03/09/2016  . S/P laparoscopic cholecystectomy & ERCP March 2013 05/06/2011  . Symptomatic cholelithiasis 04/20/2011  . Choledocholithiasis 04/20/2011    Zena Amos, PT 02/06/2018, 8:40 PM  [facility] [address] [place], [place], [address] Phone: [phone]   Fax:  [phone]  Name: Daniel Morales MRN: [id number] Date of Birth: Feb 15, 1970

## 2018-02-08 ENCOUNTER — Encounter: Payer: Self-pay | Admitting: Occupational Therapy

## 2018-02-08 ENCOUNTER — Ambulatory Visit: Payer: BC Managed Care – PPO | Admitting: Occupational Therapy

## 2018-02-08 DIAGNOSIS — M6281 Muscle weakness (generalized): Secondary | ICD-10-CM | POA: Diagnosis not present

## 2018-02-08 DIAGNOSIS — M79641 Pain in right hand: Secondary | ICD-10-CM

## 2018-02-08 DIAGNOSIS — M79642 Pain in left hand: Secondary | ICD-10-CM

## 2018-02-08 DIAGNOSIS — R29818 Other symptoms and signs involving the nervous system: Secondary | ICD-10-CM

## 2018-02-08 NOTE — Patient Instructions (Signed)
   Strengthening: Resisted Flexion   Hold tubing with __one__ arm(s) at side. Pull forward and up. Move shoulder through pain-free range of motion. Repeat with other hand, hold chair back with other hand for balance Repeat __10__ times per set.  Do _1 session 2-3 x week   Strengthening: Resisted Extension   Hold tubing in __one_ hand(s), arm forward. Pull arm back, elbow straight. Repeat _10___ times per set. Do 2-3 sessions per week repeat with other arm, hold chair back for balance   Resisted Horizontal Abduction: Bilateral   Sit or stand, tubing in both hands, arms out in front. Keeping arms straight, pinch shoulder blades together and stretch arms out. Repeat with other arm Repeat _10___ times per set. Do 2-3___ sessions per week   Elbow Flexion: Resisted   With tubing held in __one___ hand(s) and other end secured under foot, curl arm up as far as possible. Repeat with other arm Repeat _10___ times per set. Do _2-3 sessions per week    Elbow Extension: Resisted   Sit in chair with resistive band or holding  with other hand and __one_____ elbow bent. Straighten elbow. Repeat for other arm Repeat _10___ times per set.  Do _2-3___ sessions per week   Copyright  VHI. All rights reserved.

## 2018-02-09 NOTE — Therapy (Signed)
[facility] [address] [place], [place], [address] Phone: [phone]   Fax:  [phone]  Occupational Therapy Treatment  Patient Details  Name: Daniel Morales MRN: [id number] Date of Birth: 1970/09/13 Referring Provider (OT): Dr. Nicholos Johns   Encounter Date: 02/08/2018  OT End of Session - 02/08/18 1407    Visit Number  2    Number of Visits  9    Date for OT Re-Evaluation  03/13/18    Authorization Type  state BCBS    OT Start Time  1405    OT Stop Time  1445    OT Time Calculation (min)  40 min    Behavior During Therapy  [facility] for tasks assessed/performed       Past Medical History:  Diagnosis Date  . Constipation   . Environmental allergies    year round  . GERD (gastroesophageal reflux disease)    "heartburn at times"  . Headache(784.0)   . Shortness of breath    "bc of my allergies"    Past Surgical History:  Procedure Laterality Date  . CHOLECYSTECTOMY  04/21/2011   Procedure: LAPAROSCOPIC CHOLECYSTECTOMY WITH INTRAOPERATIVE CHOLANGIOGRAM;  Surgeon: Valarie Merino, MD;  Location: [facility];  Service: General;  Laterality: N/A;  . CIRCUMCISION    . ERCP  04/20/2011   Procedure: ENDOSCOPIC RETROGRADE CHOLANGIOPANCREATOGRAPHY (ERCP);  Surgeon: Petra Kuba, MD;  Location: [facility];  Service: Endoscopy;  Laterality: N/A;    There were no vitals filed for this visit.  Subjective Assessment - 02/08/18 1406    Pertinent History  MS, B 12 deficiency    Patient Stated Goals  to get rid of MS symptoms    Currently in Pain?  Yes    Pain Score  5     Pain Location  Hand   feet   Pain Orientation  Right;Left    Pain Descriptors / Indicators  Aching;Burning    Pain Onset  More than a month ago    Pain Frequency  Intermittent    Aggravating Factors   burning    Pain Relieving Factors  unsure               treatment: Arm bike x 5 mins level 5 for conditioning            OT  Education - 02/09/18 1303    Education Details  green theraband HEP,-10-15 reps each, min v.c and demonstration- see pt instructions, energy conservation stategies    Person(s) Educated  Patient    Methods  Explanation;Demonstration;Verbal cues;Handout    Comprehension  Verbalized understanding;Returned demonstration;Verbal cues required       OT Short Term Goals - 02/09/18 1257      OT SHORT TERM GOAL #1   Title  I with HEP    Time  4    Period  Weeks    Status  On-going      OT SHORT TERM GOAL #2   Title  Pt will verbalize understanding of energy conservation techniques for ADLS/IADLS    Time  4    Period  Weeks    Status  On-going      OT SHORT TERM GOAL #3   Title  Pt will demonstrate ability to perform home management/ functional activities in standing x 20 mins without rest break    Time  4    Period  Weeks    Status  On-going        OT Long  Term Goals - 02/09/18 1258      OT LONG TERM GOAL #1   Title  Pt will verbalize understanding of compensatory strategies for cognitive and  short term memory deficits.    Time  8    Period  Weeks    Status  On-going      OT LONG TERM GOAL #2   Title  Pt will perform mod complex home management and cooking at a modified independent level demonstrating good safety awareness.    Time  8    Period  Weeks    Status  On-going      OT LONG TERM GOAL #3   Title  Pt will verbalize understanding of MS community resources.    Time  8    Period  Weeks    Status  On-going            Plan - 02/09/18 1255    Clinical Impression Statement  Pt is progressing towars goals. He demonstrates improved endurance today and demonstrates good understanding of inital HEP.    Occupational Profile and client history currently impacting functional performance  The patient is limited in ability to perform household activities, community mobility and ability to work  PMH: MS, vitamin B12 deficiency, MS, macrocytic anemia, inability to work,  sleep disturbance per reports ty).     Occupational performance deficits (Please refer to evaluation for details):  ADL's;IADL's;Rest and Sleep;Leisure;Social Participation    Rehab Potential  Fair    Current Impairments/barriers affecting progress:  depression, poor activity tolerance,     OT Frequency  1x / week    OT Duration  8 weeks    OT Treatment/Interventions  Self-care/ADL training;Therapeutic exercise;Visual/perceptual remediation/compensation;Patient/family education;Splinting;Neuromuscular education;Energy conservation;Building services engineer;Therapeutic activities;Balance training;Passive range of motion;Manual Therapy;DME and/or AE instruction;Ultrasound;Cryotherapy    Plan  check on HEP, progress as able, educate regarding MS resources    Consulted and Agree with Plan of Care  Patient       Patient will benefit from skilled therapeutic intervention in order to improve the following deficits and impairments:  Abnormal gait, Decreased cognition, Decreased knowledge of use of DME, Pain, Decreased mobility, Decreased coordination, Impaired sensation, Decreased activity tolerance, Decreased endurance, Decreased strength, Impaired UE functional use, Impaired perceived functional ability, Difficulty walking, Decreased safety awareness, Decreased knowledge of precautions, Decreased balance  Visit Diagnosis: Muscle weakness (generalized)  Pain in left hand  Pain in right hand  Other symptoms and signs involving the nervous system    Problem List Patient Active Problem List   Diagnosis Date Noted  . Influenza B 04/09/2016  . Demyelinating disease (HCC)   . Anemia due to vitamin B12 deficiency   . 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 Multiple sclerosis (HCC) 03/09/2016  . Constipation 03/09/2016  . S/P laparoscopic cholecystectomy & ERCP March 2013 05/06/2011  . Symptomatic cholelithiasis 04/20/2011  . Choledocholithiasis 04/20/2011    Chelle Cayton 02/09/2018, 1:07 PM  [facility] [address] [place], [place], [address] Phone: [phone]   Fax:  [phone]  Name: Daniel Morales MRN: [id number] Date of Birth: 06/05/1970

## 2018-02-10 ENCOUNTER — Ambulatory Visit: Payer: BC Managed Care – PPO | Admitting: Rehabilitative and Restorative Service Providers"

## 2018-02-10 ENCOUNTER — Encounter: Payer: Self-pay | Admitting: Rehabilitative and Restorative Service Providers"

## 2018-02-10 DIAGNOSIS — R29818 Other symptoms and signs involving the nervous system: Secondary | ICD-10-CM

## 2018-02-10 DIAGNOSIS — M6281 Muscle weakness (generalized): Secondary | ICD-10-CM | POA: Diagnosis not present

## 2018-02-10 DIAGNOSIS — R2689 Other abnormalities of gait and mobility: Secondary | ICD-10-CM

## 2018-02-10 NOTE — Patient Instructions (Addendum)
Access Code: [facility]  URL: [email or web address]  Date: 02/10/2018  Prepared by: Margretta Ditty   Program Notes  Walking: 5 minutes at a time 3 times/ day.   Exercises Seated Hamstring Stretch with Chair - 3 reps - 3 sets - 30 seconds hold - 1x daily - 7x weekly Standing Gastroc Stretch - 3 reps - 3 sets - 30 seconds hold - 1x daily - 7x weekly Thomas Stretch on Table - 3 reps - 1 sets - 30 seconds hold - 1x daily - 7x weekly

## 2018-02-10 NOTE — Therapy (Signed)
[facility] [address] [place], [place], [address] Phone: [phone]   Fax:  [phone]  Physical Therapy Treatment  Patient Details  Name: Daniel Morales MRN: [id number] Date of Birth: 03/10/1970 Referring Provider (PT): Lavonia Dana, MD   Encounter Date: 02/10/2018  PT End of Session - 02/10/18 1441    Visit Number  5    Number of Visits  17   eval + 16 visits   Date for PT Re-Evaluation  03/10/18    Authorization Type  BCBS State/ awaiting further insurance notes in epic    PT Start Time  1405    PT Stop Time  1445    PT Time Calculation (min)  40 min    Activity Tolerance  Patient tolerated treatment well;Patient limited by fatigue    Behavior During Therapy  [facility] for tasks assessed/performed       Past Medical History:  Diagnosis Date  . Constipation   . Environmental allergies    year round  . GERD (gastroesophageal reflux disease)    "heartburn at times"  . Headache(784.0)   . Shortness of breath    "bc of my allergies"    Past Surgical History:  Procedure Laterality Date  . CHOLECYSTECTOMY  04/21/2011   Procedure: LAPAROSCOPIC CHOLECYSTECTOMY WITH INTRAOPERATIVE CHOLANGIOGRAM;  Surgeon: Pedro Earls, MD;  Location: [facility];  Service: General;  Laterality: N/A;  . CIRCUMCISION    . ERCP  04/20/2011   Procedure: ENDOSCOPIC RETROGRADE CHOLANGIOPANCREATOGRAPHY (ERCP);  Surgeon: Jeryl Columbia, MD;  Location: [facility];  Service: Endoscopy;  Laterality: N/A;    There were no vitals filed for this visit.  Subjective Assessment - 02/10/18 1409    Subjective  The patient reports he has not been sleeping as much during the day.  "I was out in the living room  more sitting in the chair."     The patient had first infusion of Ocrevace in November 2019 (2x/year infusion rate).      Pertinent History  vitamin B12 deficiency, MS, macrocytic anemia    Patient Stated Goals  "Strengthening my  legs";  fatigue- patient is sleeping off and on all day.   Takes a long time to get ready and is limited by fatigue.    Currently in Pain?  No/denies   "my arm is a little sore"        [facility] PT Assessment - 02/10/18 1409      Berg Balance Test   Sit to Stand  Able to stand without using hands and stabilize independently    Standing Unsupported  Able to stand safely 2 minutes    Sitting with Back Unsupported but Feet Supported on Floor or Stool  Able to sit safely and securely 2 minutes    Stand to Sit  Sits safely with minimal use of hands    Transfers  Able to transfer safely, minor use of hands    Standing Unsupported with Eyes Closed  Able to stand 10 seconds safely    Standing Ubsupported with Feet Together  Able to place feet together independently and stand 1 minute safely    From Standing, Reach Forward with Outstretched Arm  Can reach forward >12 cm safely (5")    From Standing Position, Pick up Object from Floor  Able to pick up shoe safely and easily    From Standing Position, Turn to Look Behind Over each Shoulder  Looks behind from both sides and weight shifts well  Turn 360 Degrees  Able to turn 360 degrees safely one side only in 4 seconds or less    Standing Unsupported, Alternately Place Feet on Step/Stool  Able to stand independently and safely and complete 8 steps in 20 seconds    Standing Unsupported, One Foot in Front  Able to plae foot ahead of the other independently and hold 30 seconds    Standing on One Leg  Able to lift leg independently and hold 5-10 seconds    Total Score  52    Berg comment:  52/56 improved from 43/56.                    [facility] Adult PT Treatment/Exercise - 02/10/18 1409      Ambulation/Gait   Ambulation/Gait  Yes    Ambulation/Gait Assistance  6: Modified independent (Device/Increase time)    Ambulation Distance (Feet)  600 Feet    Assistive device  Straight cane;None    Gait Pattern  Step-through pattern    Ambulation  Surface  Level;Indoor    Gait velocity  2.35 ft/sec with SPC coming into clinic and 2.15 ft/sec without device.       Self-Care   Self-Care  Other Self-Care Comments    Other Self-Care Comments   Discussed continuing to increase walking time and time out of bed throughout the day.       Exercises   Exercises  Other Exercises    Other Exercises   Reviewed prior home stretching encouraging daily participation.              PT Education - 02/10/18 1449    Education Details  reviewed prior HEP and put code from [place] into EPIC    Person(s) Educated  Patient    Methods  Explanation;Demonstration;Handout    Comprehension  Returned demonstration;Verbalized understanding       PT Short Term Goals - 02/10/18 1431      PT SHORT TERM GOAL #1   Title  The patient will verbalize understanding of energy conservation techniques due to reports of fatigue limiting daily tasks. Target all STGs 02/17/18-extended due to missed 1 week    Baseline  PT has focused more on a schedule to have patient get out of bed for longer periods during the day.     Time  4    Period  Weeks    Status  Partially Met      PT SHORT TERM GOAL #2   Title  The patient will improve Berg from 43/56 to > or equal to 48/56 to demo dec'ing risk for falls.    Baseline  52/56.    Time  4    Period  Weeks    Status  Achieved      PT SHORT TERM GOAL #3   Title  The patient will improve gait speed from 1.55 ft/sec to > or equal to 2.0 ft/sec to demo dec'ing risk for falls.    Time  4    Period  Weeks    Status  Achieved      PT SHORT TERM GOAL #4   Title  The patient will ambulate nonstop x 500 ft with least restrictive device mod indep in order to perform increasing community ambulation.    Time  4    Period  Weeks    Status  Achieved      PT SHORT TERM GOAL #5   Title  The patient will move floor<>stand with UE support  mod indep.    Time  4    Period  Weeks    Status  On-going        PT Long Term  Goals - 02/06/18 2035      PT LONG TERM GOAL #1   Title  The patient will be indep with HEP for LE strengthening, flexibility, core stability and general conditioning. Target date all LTGs 03/17/18-extended due to missed one week    Time  8    Period  Weeks      PT LONG TERM GOAL #2   Title  The patient will improve gait speed from 1.55 ft/sec to > or equal to 2.5 ft/sec to demo improving mobility for community ambulation.    Time  8    Period  Weeks      PT LONG TERM GOAL #3   Title   The patient will improve Berg balance score from 43/56 to > or equal to 50/56 to demo dec'ing risk for falls.    Time  8    Period  Weeks      PT LONG TERM GOAL #4   Title  The patient will negotiate 12 steps without a handrail and reciprocal pattern independently.    Time  8    Period  Weeks      PT LONG TERM GOAL #5   Title  The patient will negotiate community surfaces without a device independently x 1500 ft nonstop including grass, ramps, curbs to demo return to full community mobility.    Time  8    Period  Weeks            Plan - 02/10/18 1450    Clinical Impression Statement  The patient met 3 STGs and partially met 1 STG.  PT to check remaining STG next visit.  Continue working towards improving balance and gait for improved functional mobility.    PT Treatment/Interventions  ADLs/Self Care Home Management;Balance training;Neuromuscular re-education;Patient/family education;Gait training;Stair training;Functional mobility training;Therapeutic activities;Therapeutic exercise;Manual techniques    PT Next Visit Plan  check REMAINING STG; ?has he done stretches/HEP? progress walking distance dec'ing reliance on SPC, standing balance and strengthening activities, endurance, stretching/general flexibility.     Consulted and Agree with Plan of Care  Patient       Patient will benefit from skilled therapeutic intervention in order to improve the following deficits and impairments:  Abnormal  gait, Decreased endurance, Impaired sensation, Decreased activity tolerance, Decreased balance, Decreased mobility, Pain, Decreased strength  Visit Diagnosis: Muscle weakness (generalized)  Other symptoms and signs involving the nervous system  Other abnormalities of gait and mobility     Problem List Patient Active Problem List   Diagnosis Date Noted  . Influenza B 04/09/2016  . Demyelinating disease ([place])   . Anemia due to vitamin B12 deficiency   . 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 Multiple sclerosis ([place]) 03/09/2016  . Constipation 03/09/2016  . S/P laparoscopic cholecystectomy & ERCP March 2013 05/06/2011  . Symptomatic cholelithiasis 04/20/2011  . Choledocholithiasis 04/20/2011    Lourdez Mcgahan, PT 02/10/2018, 2:53 PM  [facility] [address] [place], [place], [address] Phone: [phone]   Fax:  [phone]  Name: Daniel Morales MRN: [id number] Date of Birth: [birth year]

## 2018-02-13 ENCOUNTER — Ambulatory Visit: Payer: BC Managed Care – PPO | Admitting: Rehabilitative and Restorative Service Providers"

## 2018-02-13 ENCOUNTER — Encounter: Payer: Self-pay | Admitting: Rehabilitative and Restorative Service Providers"

## 2018-02-13 DIAGNOSIS — M6281 Muscle weakness (generalized): Secondary | ICD-10-CM | POA: Diagnosis not present

## 2018-02-13 DIAGNOSIS — R2689 Other abnormalities of gait and mobility: Secondary | ICD-10-CM

## 2018-02-13 DIAGNOSIS — R29818 Other symptoms and signs involving the nervous system: Secondary | ICD-10-CM

## 2018-02-13 NOTE — Therapy (Signed)
[facility] [address] [place], [place], [address] Phone: [phone]   Fax:  [phone]  Physical Therapy Treatment  Patient Details  Name: Daniel Morales MRN: [id number] Date of Birth: 1970/04/17 Referring Provider (PT): Lavonia Dana, MD   Encounter Date: 02/13/2018  PT End of Session - 02/13/18 1414    Visit Number  6    Number of Visits  17   eval + 16 visits   Date for PT Re-Evaluation  03/10/18    Authorization Type  BCBS State/ awaiting further insurance notes in epic    PT Start Time  1404    PT Stop Time  1445    PT Time Calculation (min)  41 min    Activity Tolerance  Patient tolerated treatment well;Patient limited by fatigue    Behavior During Therapy  [facility] for tasks assessed/performed       Past Medical History:  Diagnosis Date  . Constipation   . Environmental allergies    year round  . GERD (gastroesophageal reflux disease)    "heartburn at times"  . Headache(784.0)   . Shortness of breath    "bc of my allergies"    Past Surgical History:  Procedure Laterality Date  . CHOLECYSTECTOMY  04/21/2011   Procedure: LAPAROSCOPIC CHOLECYSTECTOMY WITH INTRAOPERATIVE CHOLANGIOGRAM;  Surgeon: Pedro Earls, MD;  Location: [facility];  Service: General;  Laterality: N/A;  . CIRCUMCISION    . ERCP  04/20/2011   Procedure: ENDOSCOPIC RETROGRADE CHOLANGIOPANCREATOGRAPHY (ERCP);  Surgeon: Jeryl Columbia, MD;  Location: [facility];  Service: Endoscopy;  Laterality: N/A;    There were no vitals filed for this visit.  Subjective Assessment - 02/13/18 1405    Subjective  The stiffness and weakness are worse today.  He notes he is having to use the cane more in the house and got increased burning in his legs beginning yesterday.  Feels there may be a pattern associated with rainy weather today.      Pertinent History  vitamin B12 deficiency, MS, macrocytic anemia    Patient Stated Goals   "Strengthening my legs";  fatigue- patient is sleeping off and on all day.   Takes a long time to get ready and is limited by fatigue.    Currently in Pain?  Yes    Pain Score  6     Pain Location  Leg    Pain Descriptors / Indicators  Tightness;Burning    Pain Type  Chronic pain;Neuropathic pain    Pain Onset  More than a month ago    Pain Frequency  Intermittent    Aggravating Factors   worse today and yesterday    Pain Relieving Factors  unsure    Effect of Pain on Daily Activities  feels greater fatigue                       [facility] Adult PT Treatment/Exercise - 02/13/18 1408      Ambulation/Gait   Ambulation/Gait  Yes    Ambulation/Gait Assistance  6: Modified independent (Device/Increase time);5: Supervision    Ambulation/Gait Assistance Details  Patient notes worsening unsteadiness today needing cane due to leg pain, and fatigue.  He notes when he has pain in his legs he gets down and in a bad mood.   He notes other symptoms of forgetfulness, word finding.      Ambulation Distance (Feet)  345 Feet   230 x 3 reps   Assistive device  Straight cane;None    Gait Pattern  Step-to pattern   more hesitant and apprehsnive today with weakness reported   Ambulation Surface  Level;Indoor    Gait Comments  Gait with ball overhead.       Exercises   Exercises  Other Exercises    Other Exercises   Wall squats x 10 reps; heel raises/toe raises x 10 reps with intermittent UE support, standing marching x 10 reps alternating LE activities.   Step ups x 10 reps without UE support R and L sides.                 PT Short Term Goals - 02/10/18 1431      PT SHORT TERM GOAL #1   Title  The patient will verbalize understanding of energy conservation techniques due to reports of fatigue limiting daily tasks. Target all STGs 02/17/18-extended due to missed 1 week    Baseline  PT has focused more on a schedule to have patient get out of bed for longer periods during the day.      Time  4    Period  Weeks    Status  Partially Met      PT SHORT TERM GOAL #2   Title  The patient will improve Berg from 43/56 to > or equal to 48/56 to demo dec'ing risk for falls.    Baseline  52/56.    Time  4    Period  Weeks    Status  Achieved      PT SHORT TERM GOAL #3   Title  The patient will improve gait speed from 1.55 ft/sec to > or equal to 2.0 ft/sec to demo dec'ing risk for falls.    Time  4    Period  Weeks    Status  Achieved      PT SHORT TERM GOAL #4   Title  The patient will ambulate nonstop x 500 ft with least restrictive device mod indep in order to perform increasing community ambulation.    Time  4    Period  Weeks    Status  Achieved      PT SHORT TERM GOAL #5   Title  The patient will move floor<>stand with UE support mod indep.    Time  4    Period  Weeks    Status  On-going        PT Long Term Goals - 02/13/18 1441      PT LONG TERM GOAL #1   Title  The patient will be indep with HEP for LE strengthening, flexibility, core stability and general conditioning. Target date all LTGs 03/17/18-extended due to missed one week    Time  8    Period  Weeks      PT LONG TERM GOAL #2   Title  The patient will improve gait speed from 1.55 ft/sec to > or equal to 2.5 ft/sec to demo improving mobility for community ambulation.    Time  8    Period  Weeks      PT LONG TERM GOAL #3   Title   The patient will improve Berg balance score from 43/56 to > or equal to 50/56 to demo dec'ing risk for falls.    Baseline  Met on 02/10/2018    Time  8    Period  Weeks    Status  Achieved      PT LONG TERM GOAL #4   Title  The  patient will negotiate 12 steps without a handrail and reciprocal pattern independently.    Time  8    Period  Weeks      PT LONG TERM GOAL #5   Title  The patient will negotiate community surfaces without a device independently x 1500 ft nonstop including grass, ramps, curbs to demo return to full community mobility.    Time  8    Period   Weeks            Plan - 02/13/18 1514    Clinical Impression Statement  PT did not check floor<>stand goal for STG as patient not having a good day-- w ill defer to next visit as able.  Patient had a set back over the weekend with increased burning and pain in his legs, which seems to increase fatigue.     PT Treatment/Interventions  ADLs/Self Care Home Management;Balance training;Neuromuscular re-education;Patient/family education;Gait training;Stair training;Functional mobility training;Therapeutic activities;Therapeutic exercise;Manual techniques    PT Next Visit Plan  check REMAINING STG; ?has he done stretches/HEP? progress walking distance dec'ing reliance on SPC, standing balance and strengthening activities, endurance, stretching/general flexibility.     Consulted and Agree with Plan of Care  Patient       Patient will benefit from skilled therapeutic intervention in order to improve the following deficits and impairments:  Abnormal gait, Decreased endurance, Impaired sensation, Decreased activity tolerance, Decreased balance, Decreased mobility, Pain, Decreased strength  Visit Diagnosis: Muscle weakness (generalized)  Other symptoms and signs involving the nervous system  Other abnormalities of gait and mobility     Problem List Patient Active Problem List   Diagnosis Date Noted  . Influenza B 04/09/2016  . Demyelinating disease ([facility])   . Anemia due to vitamin B12 deficiency   . 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 Multiple sclerosis ([place]) 03/09/2016  . Constipation 03/09/2016  . S/P laparoscopic cholecystectomy & ERCP March 2013 05/06/2011  . Symptomatic cholelithiasis 04/20/2011  . Choledocholithiasis 04/20/2011    Amyr Sluder, PT 02/13/2018, 3:15 PM  [facility] [address] [place], [place], [address] Phone: [phone]   Fax:  [phone]  Name: Daniel Morales MRN: [id number] Date of Birth:  1970/04/10

## 2018-02-15 ENCOUNTER — Ambulatory Visit: Payer: BC Managed Care – PPO | Admitting: Occupational Therapy

## 2018-02-15 DIAGNOSIS — M6281 Muscle weakness (generalized): Secondary | ICD-10-CM

## 2018-02-15 DIAGNOSIS — R29818 Other symptoms and signs involving the nervous system: Secondary | ICD-10-CM

## 2018-02-15 NOTE — Therapy (Signed)
[facility] [address] [place], [place], [address] Phone: [phone]   Fax:  [phone]  Occupational Therapy Treatment  Patient Details  Name: Daniel Morales MRN: [id number] Date of Birth: October 12, 1970 Referring Provider (OT): Dr. Nicholos Johns   Encounter Date: 02/15/2018  OT End of Session - 02/15/18 1553    Visit Number  3    Number of Visits  9    Date for OT Re-Evaluation  03/13/18    Authorization Type  state BCBS    OT Start Time  1535    OT Stop Time  1615    OT Time Calculation (min)  40 min       Past Medical History:  Diagnosis Date  . Constipation   . Environmental allergies    year round  . GERD (gastroesophageal reflux disease)    "heartburn at times"  . Headache(784.0)   . Shortness of breath    "bc of my allergies"    Past Surgical History:  Procedure Laterality Date  . CHOLECYSTECTOMY  04/21/2011   Procedure: LAPAROSCOPIC CHOLECYSTECTOMY WITH INTRAOPERATIVE CHOLANGIOGRAM;  Surgeon: Valarie Merino, MD;  [facility]: [facility];  Service: General;  Laterality: N/A;  . CIRCUMCISION    . ERCP  04/20/2011   Procedure: ENDOSCOPIC RETROGRADE CHOLANGIOPANCREATOGRAPHY (ERCP);  Surgeon: Petra Kuba, MD;  Location: [facility];  Service: Endoscopy;  Laterality: N/A;    There were no vitals filed for this visit.  Subjective Assessment - 02/15/18 1542    Subjective   Pt reports tightness in shoulder blade    Pertinent History  MS, B 12 deficiency    Patient Stated Goals  to get rid of MS symptoms    Currently in Pain?  Yes    Pain Score  6     Pain Location  --   shoulder blade   Pain Orientation  Right    Pain Descriptors / Indicators  Sharp    Pain Type  Chronic pain    Pain Onset  More than a month ago    Pain Frequency  Intermittent    Aggravating Factors   malpositioning    Pain Relieving Factors  repositioning              Treatment: supine closed chain shoulder flexion  followed by upper trunk rotation and diagonals with bilateral UE's, cat/ cow positions, and rocking forwards and backwards, while weight bearing through mat for gentle stretch Wallpushups and wall slides x 10 reps min v.c Pt reports pain has gone away after stretching. Reviewed green theraband ex from last visit 10 reps each,  Min v.c Arm bike x 5 mins level 6 for conditioning.              OT Short Term Goals - 02/09/18 1257      OT SHORT TERM GOAL #1   Title  I with HEP    Time  4    Period  Weeks    Status  On-going      OT SHORT TERM GOAL #2   Title  Pt will verbalize understanding of energy conservation techniques for ADLS/IADLS    Time  4    Period  Weeks    Status  On-going      OT SHORT TERM GOAL #3   Title  Pt will demonstrate ability to perform home management/ functional activities in standing x 20 mins without rest break    Time  4    Period  Weeks    Status  On-going        OT Long Term Goals - 02/09/18 1258      OT LONG TERM GOAL #1   Title  Pt will verbalize understanding of compensatory strategies for cognitive and  short term memory deficits.    Time  8    Period  Weeks    Status  On-going      OT LONG TERM GOAL #2   Title  Pt will perform mod complex home management and cooking at a modified independent level demonstrating good safety awareness.    Time  8    Period  Weeks    Status  On-going      OT LONG TERM GOAL #3   Title  Pt will verbalize understanding of MS community resources.    Time  8    Period  Weeks    Status  On-going            Plan - 02/15/18 1554    Clinical Impression Statement  Pt is progressing towards goals. He demonstrates improved endurance today and reports feeling better at end of session than he did at beginning    Occupational Profile and client history currently impacting functional performance  The patient is limited in ability to perform household activities, community mobility and ability to work   PMH: MS, vitamin B12 deficiency, MS, macrocytic anemia, inability to work, sleep disturbance per reports ty).     Rehab Potential  Fair    Current Impairments/barriers affecting progress:  depression, poor activity tolerance,     OT Frequency  1x / week    OT Duration  8 weeks    OT Treatment/Interventions  Self-care/ADL training;Therapeutic exercise;Visual/perceptual remediation/compensation;Patient/family education;Splinting;Neuromuscular education;Energy conservation;Building services engineer;Therapeutic activities;Balance training;Passive range of motion;Manual Therapy;DME and/or AE instruction;Ultrasound;Cryotherapy    Plan  continue stretching add to HEP as able, MS resources and standing tolerance    Consulted and Agree with Plan of Care  Patient       Patient will benefit from skilled therapeutic intervention in order to improve the following deficits and impairments:  Abnormal gait, Decreased cognition, Decreased knowledge of use of DME, Pain, Decreased mobility, Decreased coordination, Impaired sensation, Decreased activity tolerance, Decreased endurance, Decreased strength, Impaired UE functional use, Impaired perceived functional ability, Difficulty walking, Decreased safety awareness, Decreased knowledge of precautions, Decreased balance  Visit Diagnosis: Muscle weakness (generalized)  Other symptoms and signs involving the nervous system    Problem List Patient Active Problem List   Diagnosis Date Noted  . Influenza B 04/09/2016  . Demyelinating disease ([facility])   . Anemia due to vitamin B12 deficiency   . 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 Multiple sclerosis ([facility]) 03/09/2016  . Constipation 03/09/2016  . S/P laparoscopic cholecystectomy & ERCP March 2013 05/06/2011  . Symptomatic cholelithiasis 04/20/2011  . Choledocholithiasis 04/20/2011    Jeana Kersting 02/15/2018, 4:46 PM  [facility] [address] [place], [place], [address] Phone: [phone]   Fax:  [phone]  Name: Daniel Morales MRN: [id number] Date of Birth: Nov 22, 1970

## 2018-02-17 ENCOUNTER — Encounter: Payer: Self-pay | Admitting: Rehabilitative and Restorative Service Providers"

## 2018-02-17 ENCOUNTER — Ambulatory Visit: Payer: BC Managed Care – PPO | Admitting: Rehabilitative and Restorative Service Providers"

## 2018-02-17 DIAGNOSIS — R29818 Other symptoms and signs involving the nervous system: Secondary | ICD-10-CM

## 2018-02-17 DIAGNOSIS — M6281 Muscle weakness (generalized): Secondary | ICD-10-CM | POA: Diagnosis not present

## 2018-02-17 DIAGNOSIS — R2689 Other abnormalities of gait and mobility: Secondary | ICD-10-CM

## 2018-02-17 NOTE — Therapy (Signed)
[facility] [address] [place], [place], [address] Phone: [phone]   Fax:  [phone]  Physical Therapy Treatment  Patient Details  Name: Daniel Morales MRN: [id number] Date of Birth: 1970-04-30 Referring Provider (PT): Lavonia Dana, MD   Encounter Date: 02/17/2018  PT End of Session - 02/17/18 1410    Visit Number  7    Number of Visits  17   eval + 16 visits   Date for PT Re-Evaluation  03/10/18    Authorization Type  BCBS State/ awaiting further insurance notes in epic    PT Start Time  1405    PT Stop Time  1448    PT Time Calculation (min)  43 min    Activity Tolerance  Patient tolerated treatment well;Patient limited by fatigue    Behavior During Therapy  [facility] for tasks assessed/performed       Past Medical History:  Diagnosis Date  . Constipation   . Environmental allergies    year round  . GERD (gastroesophageal reflux disease)    "heartburn at times"  . Headache(784.0)   . Shortness of breath    "bc of my allergies"    Past Surgical History:  Procedure Laterality Date  . CHOLECYSTECTOMY  04/21/2011   Procedure: LAPAROSCOPIC CHOLECYSTECTOMY WITH INTRAOPERATIVE CHOLANGIOGRAM;  Surgeon: Pedro Earls, MD;  Location: [facility];  Service: General;  Laterality: N/A;  . CIRCUMCISION    . ERCP  04/20/2011   Procedure: ENDOSCOPIC RETROGRADE CHOLANGIOPANCREATOGRAPHY (ERCP);  Surgeon: Jeryl Columbia, MD;  Location[facility];  Service: Endoscopy;  Laterality: N/A;    There were no vitals filed for this visit.  Subjective Assessment - 02/17/18 1407    Subjective  The patient is having an "MS hug" and it is hard to get a deep breath.  Overall, he is improved from the other day.      Pertinent History  vitamin B12 deficiency, MS, macrocytic anemia    Patient Stated Goals  "Strengthening my legs";  fatigue- patient is sleeping off and on all day.   Takes a long time to get ready and is limited  by fatigue.    Currently in Pain?  Yes   "just a little soreness and tightness."   Pain Location  Leg    Pain Orientation  Right;Left    Pain Descriptors / Indicators  Tightness    Pain Onset  More than a month ago    Pain Frequency  Intermittent    Aggravating Factors   just tightness    Pain Relieving Factors  movement helps         [facility] PT Assessment - 02/17/18 1410      Ambulation/Gait   Ambulation/Gait Assistance  7: Independent    Ambulation/Gait Assistance Details  The patient is demonstrating progress with     Ambulation Distance (Feet)  400 Feet    Assistive device  Straight cane;None    Gait Pattern  Step-through pattern    Ambulation Surface  Level;Indoor    Stairs Assistance  6: Modified independent (Device/Increase time)   slowed pace   Stair Management Technique  No rails;Alternating pattern    Number of Stairs  12       dynamic gait with marching, forwards walking, bachwards walking with supervision x 300 ft.      [facility] Adult PT Treatment/Exercise - 02/17/18 1410      Transfers   Comments  floor<>stand mod indep.      Ambulation/Gait  Ambulation/Gait  Yes    Stairs  Yes      Self-Care   Self-Care  Other Self-Care Comments    Other Self-Care Comments   [facility] [employer] is close to his home-- discussed beginning to transition to a community program.        Neuro Re-ed    Neuro Re-ed Details   Single leg stance activities, single limb with hip rotation, standing in stride with heel raises for balance/strength.      Exercises   Exercises  Other Exercises    Other Exercises   Standing wall squats x 10 reps, standing heel raises x 10 reps without UE support.  Seated hamstring stool pulls on rolling stool. Standing hamstring curls (easy for patient - did 1-2 reps and then modified to strengthening on stool).  Added 2 strengthening activities to HEP.               PT Short Term Goals - 02/17/18 1418      PT SHORT TERM GOAL #1   Title  The  patient will verbalize understanding of energy conservation techniques due to reports of fatigue limiting daily tasks. Target all STGs 02/17/18-extended due to missed 1 week    Baseline  PT has focused more on a schedule to have patient get out of bed for longer periods during the day.     Time  4    Period  Weeks    Status  Partially Met      PT SHORT TERM GOAL #2   Title  The patient will improve Berg from 43/56 to > or equal to 48/56 to demo dec'ing risk for falls.    Baseline  52/56.    Time  4    Period  Weeks    Status  Achieved      PT SHORT TERM GOAL #3   Title  The patient will improve gait speed from 1.55 ft/sec to > or equal to 2.0 ft/sec to demo dec'ing risk for falls.    Time  4    Period  Weeks    Status  Achieved      PT SHORT TERM GOAL #4   Title  The patient will ambulate nonstop x 500 ft with least restrictive device mod indep in order to perform increasing community ambulation.    Time  4    Period  Weeks    Status  Achieved      PT SHORT TERM GOAL #5   Title  The patient will move floor<>stand with UE support mod indep.    Baseline  Patient is able to do without leaning on support of a mat-- pressing UEs through the floor.    Time  4    Period  Weeks    Status  Achieved        PT Long Term Goals - 02/13/18 1441      PT LONG TERM GOAL #1   Title  The patient will be indep with HEP for LE strengthening, flexibility, core stability and general conditioning. Target date all LTGs 03/17/18-extended due to missed one week    Time  8    Period  Weeks      PT LONG TERM GOAL #2   Title  The patient will improve gait speed from 1.55 ft/sec to > or equal to 2.5 ft/sec to demo improving mobility for community ambulation.    Time  8    Period  Weeks  PT LONG TERM GOAL #3   Title   The patient will improve Berg balance score from 43/56 to > or equal to 50/56 to demo dec'ing risk for falls.    Baseline  Met on 02/10/2018    Time  8    Period  Weeks    Status   Achieved      PT LONG TERM GOAL #4   Title  The patient will negotiate 12 steps without a handrail and reciprocal pattern independently.    Time  8    Period  Weeks      PT LONG TERM GOAL #5   Title  The patient will negotiate community surfaces without a device independently x 1500 ft nonstop including grass, ramps, curbs to demo return to full community mobility.    Time  8    Period  Weeks            Plan - 02/17/18 1622    Clinical Impression Statement  The patient met STG for floor<>stand goal.  He is progressing with gait, although he continues with slower/guarded gait.  PT is encouraging participation in Daniel Morales.  Plan to continue to focus on increasing community wellness activities and encouraging return to recreational activities.     PT Treatment/Interventions  ADLs/Self Care Home Management;Balance training;Neuromuscular re-education;Patient/family education;Gait training;Stair training;Functional mobility training;Therapeutic activities;Therapeutic exercise;Manual techniques    PT Next Visit Plan  Check on HEP, progress community walking dec'ing use of SpC, standing balance, endurance, strengthening, general conditining.    Consulted and Agree with Plan of Care  Patient       Patient will benefit from skilled therapeutic intervention in order to improve the following deficits and impairments:  Abnormal gait, Decreased endurance, Impaired sensation, Decreased activity tolerance, Decreased balance, Decreased mobility, Pain, Decreased strength  Visit Diagnosis: Muscle weakness (generalized)  Other symptoms and signs involving the nervous system  Other abnormalities of gait and mobility     Problem List Patient Active Problem List   Diagnosis Date Noted  . Influenza B 04/09/2016  . Demyelinating disease ([place])   . Anemia due to vitamin B12 deficiency   . 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 Multiple sclerosis ([place]) 03/09/2016  . Constipation 03/09/2016  . S/P laparoscopic  cholecystectomy & ERCP March 2013 05/06/2011  . Symptomatic cholelithiasis 04/20/2011  . Choledocholithiasis 04/20/2011    Daniel Morales, PT 02/17/2018, 4:36 PM  [facility] [address] [place], [place], [address] Phone: [phone]   Fax:  [phone]  Name: Daniel Morales MRN: [id number] Date of Birth: 1970-03-26

## 2018-02-17 NOTE — Patient Instructions (Signed)
Access Code: [facility]  URL: [email or web address]  Date: 02/17/2018  Prepared by: Margretta Ditty   Program Notes  Walking: 5 minutes at a time 3 times/ day.   Exercises Seated Hamstring Stretch with Chair - 3 reps - 3 sets - 30 seconds hold - 1x daily - 7x weekly Standing Gastroc Stretch - 3 reps - 3 sets - 30 seconds hold - 1x daily - 7x weekly Thomas Stretch on Table - 3 reps - 1 sets - 30 seconds hold - 1x daily - 7x weekly Wall Squat - 10 reps - 1 sets - 1x daily - 7x weekly Standing Heel Raise - 10 reps - 1 sets - 1x daily - 7x weekly

## 2018-02-20 ENCOUNTER — Ambulatory Visit: Payer: BC Managed Care – PPO | Admitting: Physical Therapy

## 2018-02-20 ENCOUNTER — Encounter: Payer: Self-pay | Admitting: Physical Therapy

## 2018-02-20 DIAGNOSIS — M6281 Muscle weakness (generalized): Secondary | ICD-10-CM

## 2018-02-20 DIAGNOSIS — R29818 Other symptoms and signs involving the nervous system: Secondary | ICD-10-CM

## 2018-02-20 NOTE — Therapy (Signed)
[facility] [address] [place], [place], [address] Phone: [phone]   Fax:  [phone]  Physical Therapy Treatment  Patient Details  Name: Daniel Morales MRN: [id number] Date of Birth: 10-20-1970 Referring Provider (PT): Lavonia Dana, MD   Encounter Date: 02/20/2018  PT End of Session - 02/20/18 1409    Visit Number  8    Number of Visits  17   eval + 16 visits   Date for PT Re-Evaluation  03/10/18    Authorization Type  BCBS State/ awaiting further insurance notes in epic; 02/20/18 VL: 30 pt, ot, slp; $1500 deductible, 30% coinsurance    Authorization - Visit Number  8   pt & ot since 02/01/18 (as of 02/20/18)   Authorization - Number of Visits  30    PT Start Time  1408   late arrival   PT Stop Time  1445    PT Time Calculation (min)  37 min    Activity Tolerance  Patient tolerated treatment well;Patient limited by fatigue    Behavior During Therapy  [facility] for tasks assessed/performed       Past Medical History:  Diagnosis Date  . Constipation   . Environmental allergies    year round  . GERD (gastroesophageal reflux disease)    "heartburn at times"  . Headache(784.0)   . Shortness of breath    "bc of my allergies"    Past Surgical History:  Procedure Laterality Date  . CHOLECYSTECTOMY  04/21/2011   Procedure: LAPAROSCOPIC CHOLECYSTECTOMY WITH INTRAOPERATIVE CHOLANGIOGRAM;  Surgeon: Pedro Earls, MD;  Location: [facility];  Service: General;  Laterality: N/A;  . CIRCUMCISION    . ERCP  04/20/2011   Procedure: ENDOSCOPIC RETROGRADE CHOLANGIOPANCREATOGRAPHY (ERCP);  Surgeon: Jeryl Columbia, MD;  Location: [facility];  Service: Endoscopy;  Laterality: N/A;    There were no vitals filed for this visit.  Subjective Assessment - 02/20/18 1409    Subjective  Feeling a little stiff today (due to the cold). Feels things are going well. Notices he is not as tired when he finishes therapy--today he is  even planning on going to the movies after PT.     Pertinent History  vitamin B12 deficiency, MS, macrocytic anemia    Patient Stated Goals  "Strengthening my legs";  fatigue- patient is sleeping off and on all day.   Takes a long time to get ready and is limited by fatigue.    Currently in Pain?  No/denies    Pain Onset  --                       [facility] Adult PT Treatment/Exercise - 02/20/18 0001      Ambulation/Gait   Ambulation/Gait Assistance  7: Independent    Ambulation/Gait Assistance Details  in gym, no cane; vc for step length and velocity; head turns without LOB    Ambulation Distance (Feet)  100 Feet   160, 200   Assistive device  None    Gait Pattern  Step-through pattern;Decreased stride length;Wide base of support   decr UE swing appears related to decr velocity   Stairs  Yes    Stairs Assistance  6: Modified independent (Device/Increase time)    Stair Management Technique  No rails;Alternating pattern    Number of Stairs  4    Height of Stairs  6      Knee/Hip Exercises: Aerobic   Nustep  L6 x 4 minutes for  warm-up       Knee/Hip Exercises: Machines for Strengthening   Total Gym Leg Press  bil 120# x 10; LLE 80# x 10; RLE 60# x 10          Balance Exercises - 02/20/18 1438      Balance Exercises: Standing   Standing Eyes Opened  Narrow base of support (BOS);Head turns;Foam/compliant surface   ft together; partial tandem; head turns, nods, diagonal    Standing Eyes Closed  Narrow base of support (BOS);Head turns;Foam/compliant surface   feet together   Other Standing Exercises  stand on blue mat with ball tosses (feet together, partial tandem either side)          PT Short Term Goals - 02/17/18 1418      PT SHORT TERM GOAL #1   Title  The patient will verbalize understanding of energy conservation techniques due to reports of fatigue limiting daily tasks. Target all STGs 02/17/18-extended due to missed 1 week    Baseline  PT has focused  more on a schedule to have patient get out of bed for longer periods during the day.     Time  4    Period  Weeks    Status  Partially Met      PT SHORT TERM GOAL #2   Title  The patient will improve Berg from 43/56 to > or equal to 48/56 to demo dec'ing risk for falls.    Baseline  52/56.    Time  4    Period  Weeks    Status  Achieved      PT SHORT TERM GOAL #3   Title  The patient will improve gait speed from 1.55 ft/sec to > or equal to 2.0 ft/sec to demo dec'ing risk for falls.    Time  4    Period  Weeks    Status  Achieved      PT SHORT TERM GOAL #4   Title  The patient will ambulate nonstop x 500 ft with least restrictive device mod indep in order to perform increasing community ambulation.    Time  4    Period  Weeks    Status  Achieved      PT SHORT TERM GOAL #5   Title  The patient will move floor<>stand with UE support mod indep.    Baseline  Patient is able to do without leaning on support of a mat-- pressing UEs through the floor.    Time  4    Period  Weeks    Status  Achieved        PT Long Term Goals - 02/13/18 1441      PT LONG TERM GOAL #1   Title  The patient will be indep with HEP for LE strengthening, flexibility, core stability and general conditioning. Target date all LTGs 03/17/18-extended due to missed one week    Time  8    Period  Weeks      PT LONG TERM GOAL #2   Title  The patient will improve gait speed from 1.55 ft/sec to > or equal to 2.5 ft/sec to demo improving mobility for community ambulation.    Time  8    Period  Weeks      PT LONG TERM GOAL #3   Title   The patient will improve Berg balance score from 43/56 to > or equal to 50/56 to demo dec'ing risk for falls.    Baseline  Met  on 02/10/2018    Time  8    Period  Weeks    Status  Achieved      PT LONG TERM GOAL #4   Title  The patient will negotiate 12 steps without a handrail and reciprocal pattern independently.    Time  8    Period  Weeks      PT LONG TERM GOAL #5    Title  The patient will negotiate community surfaces without a device independently x 1500 ft nonstop including grass, ramps, curbs to demo return to full community mobility.    Time  8    Period  Weeks            Plan - 02/20/18 1934    Clinical Impression Statement  Session focused on LE strengthening, gait training (including without device, with head turns and conversation), and balance training. Patient with increased difficulty maintaining balance with EC on compliant surface or with EO and head turns and nods. Patient is pleased with the progress he is making and is tolerating more activity. Pt can continue to benefit from PT to work towards [place].     PT Treatment/Interventions  ADLs/Self Care Home Management;Balance training;Neuromuscular re-education;Patient/family education;Gait training;Stair training;Functional mobility training;Therapeutic activities;Therapeutic exercise;Manual techniques    PT Next Visit Plan   progress community walking dec'ing use of SpC (outdoors if weather permits, or simulate outdoors), standing balance, endurance, strengthening, general conditining.    Consulted and Agree with Plan of Care  Patient       Patient will benefit from skilled therapeutic intervention in order to improve the following deficits and impairments:  Abnormal gait, Decreased endurance, Impaired sensation, Decreased activity tolerance, Decreased balance, Decreased mobility, Pain, Decreased strength  Visit Diagnosis: Muscle weakness (generalized)  Other symptoms and signs involving the nervous system     Problem List Patient Active Problem List   Diagnosis Date Noted  . Influenza B 04/09/2016  . Demyelinating disease ([place])   . Anemia due to vitamin B12 deficiency   . 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 Multiple sclerosis ([place]) 03/09/2016  . Constipation 03/09/2016  . S/P laparoscopic cholecystectomy & ERCP March 2013 05/06/2011  . Symptomatic cholelithiasis 04/20/2011  .  Choledocholithiasis 04/20/2011    Rexanne Mano, PT 02/20/2018, 7:49 PM  [facility] [address] [place], [place], [address] Phone: [phone]   Fax:  [phone]  Name: Daniel Morales MRN: [id number] Date of Birth: 23-Apr-1970

## 2018-02-22 ENCOUNTER — Ambulatory Visit: Payer: BC Managed Care – PPO | Admitting: Occupational Therapy

## 2018-02-22 DIAGNOSIS — R2689 Other abnormalities of gait and mobility: Secondary | ICD-10-CM

## 2018-02-22 DIAGNOSIS — M79641 Pain in right hand: Secondary | ICD-10-CM

## 2018-02-22 DIAGNOSIS — M25571 Pain in right ankle and joints of right foot: Secondary | ICD-10-CM

## 2018-02-22 DIAGNOSIS — M6281 Muscle weakness (generalized): Secondary | ICD-10-CM | POA: Diagnosis not present

## 2018-02-22 DIAGNOSIS — R29818 Other symptoms and signs involving the nervous system: Secondary | ICD-10-CM

## 2018-02-22 DIAGNOSIS — M25572 Pain in left ankle and joints of left foot: Secondary | ICD-10-CM

## 2018-02-22 DIAGNOSIS — M79642 Pain in left hand: Secondary | ICD-10-CM

## 2018-02-22 NOTE — Patient Instructions (Signed)
Memory Compensation Strategies  1. Use "WARM" strategy.  W= write it down  A= associate it  R= repeat it  M= make a mental note  2.   You can keep a Glass blower/designer.  Use a 3-ring notebook with sections for the following: calendar, important names and phone numbers,  medications, doctors' names/phone numbers, lists/reminders, and a section to journal what you did  each day.   3.    Use a calendar to write appointments down.  4.    Write yourself a schedule for the day.  This can be placed on the calendar or in a separate section of the Memory Notebook.  Keeping a  regular schedule can help memory.  5.    Use medication organizer with sections for each day or morning/evening pills.  You may need help loading it  6.    Keep a basket, or pegboard by the door.  Place items that you need to take out with you in the basket or on the pegboard.  You may also want to  include a message board for reminders.  7.    Use sticky notes.  Place sticky notes with reminders in a place where the task is performed.  For example: " turn off the  stove" placed by the stove, "lock the door" placed on the door at eye level, " take your medications" on  the bathroom mirror or by the place where you normally take your medications.  8.    Use alarms/timers.  Use while cooking to remind yourself to check on food or as a reminder to take your medicine, or as a  reminder to make a call, or as a reminder to perform another task, etc.    Keeping Thinking Skills Sharp: 1. Jigsaw puzzles 2. Card/board games 3. Talking on the phone/social events 4. [email or web address] 5. Online games 6. Word serches/crossword puzzles 7.  Logic puzzles 8. Aerobic exercise (stationary bike) 9. Eating balanced diet (fruits & veggies) 10. Drink water 11. Try something new--new recipe, hobby 12. Crafts 13. Do a variety of activities that are challenging 14. Add cognitive activities to walking/exercising (think of animal/food/city with  each letter of the alphabet, counting backwards, thinking of as many vegetables as you can, etc.).--Only do this  If safe (no freezing/falls).Keeping Thinking Skills Sharp: 1. Jigsaw puzzles 2. Card/board games 3. Talking on the phone/social events 4. [email or web address] 5. Online games 6. Word serches/crossword puzzles 7.  Logic puzzles 8. Aerobic exercise (stationary bike) 9. Eating balanced diet (fruits & veggies) 10. Drink water 11. Try something new--new recipe, hobby 12. Crafts 13. Do a variety of activities that are challenging 14. Add cognitive activities to walking/exercising (think of animal/food/city with each letter of the alphabet, counting backwards, thinking of as many vegetables as you can, etc.).--Only do this  If safe (no freezing/falls).Keeping Thinking Skills Sharp: 1. Jigsaw puzzles 2. Card/board games 3. Talking on the phone/social events 4. [email or web address] 5. Online games 6. Word serches/crossword puzzles 7.  Logic puzzles 8. Aerobic exercise (stationary bike) 9. Eating balanced diet (fruits & veggies) 10. Drink water 11. Try something new--new recipe, hobby 12. Crafts 13. Do a variety of activities that are challenging 14. Add cognitive activities to walking/exercising (think of animal/food/city with each letter of the alphabet, counting backwards, thinking of as many vegetables as you can, etc.).--Only do this  If safe (no freezing/falls).Keeping Thinking Skills Sharp: 1. Jigsaw puzzles 2. Card/board games 3. Talking on the phone/social events 4. [email or web address] 5. Online games  6. Word serches/crossword puzzles 7.  Logic puzzles 8. Aerobic exercise (stationary bike) 9. Eating balanced diet (fruits & veggies) 10. Drink water 11. Try something new--new recipe, hobby 12. Crafts 13. Do a variety of activities that are challenging 14. Add cognitive activities to walking/exercising (think of animal/food/city with each letter of the alphabet, counting backwards,  thinking of as many vegetables as you can, etc.).--Only do this  If safe (no freezing/falls).Keeping Thinking Skills Sharp: 1. Jigsaw puzzles 2. Card/board games 3. Talking on the phone/social events 4. [email or web address] 5. Online games 6. Word serches/crossword puzzles 7.  Logic puzzles 8. Aerobic exercise (stationary bike) 9. Eating balanced diet (fruits & veggies) 10. Drink water 11. Try something new--new recipe, hobby 12. Crafts 13. Do a variety of activities that are challenging 14. Add cognitive activities to walking/exercising (think of animal/food/city with each letter of the alphabet, counting backwards, thinking of as many vegetables as you can, etc.).--Only do this  If safe (no freezing/falls).

## 2018-02-22 NOTE — Therapy (Signed)
[facility] [address] [place], [place], [address] Phone: [phone]   Fax:  [phone]  Occupational Therapy Treatment  Patient Details  Name: Daniel Morales MRN: [id number] Date of Birth: Jun 09, 1970 Referring Provider (OT): Dr. Ashby Dawes   Encounter Date: 02/22/2018  OT End of Session - 02/22/18 1545    Visit Number  4    Number of Visits  9    Date for OT Re-Evaluation  03/13/18    Authorization Type  state BCBS    OT Start Time  1447    OT Stop Time  1530    OT Time Calculation (min)  43 min    Behavior During Therapy  [facility] for tasks assessed/performed       Past Medical History:  Diagnosis Date  . Constipation   . Environmental allergies    year round  . GERD (gastroesophageal reflux disease)    "heartburn at times"  . Headache(784.0)   . Shortness of breath    "bc of my allergies"    Past Surgical History:  Procedure Laterality Date  . CHOLECYSTECTOMY  04/21/2011   Procedure: LAPAROSCOPIC CHOLECYSTECTOMY WITH INTRAOPERATIVE CHOLANGIOGRAM;  Surgeon: Pedro Earls, MD;  Location: [facility];  Service: General;  Laterality: N/A;  . CIRCUMCISION    . ERCP  04/20/2011   Procedure: ENDOSCOPIC RETROGRADE CHOLANGIOPANCREATOGRAPHY (ERCP);  Surgeon: Jeryl Columbia, MD;  Location: [facility];  Service: Endoscopy;  Laterality: N/A;    There were no vitals filed for this visit.  Subjective Assessment - 02/22/18 1547    Subjective   Pt reprots he's haveing a good day    Pertinent History  MS, B 12 deficiency    Patient Stated Goals  to get rid of MS symptoms    Currently in Pain?  No/denies          Treatment: Therapist reviewed progress towards goals. Pt agrees with plans to d/c today to conserve future visits. Standing activities with functional reach to copy small peg design and to place graded clothespins on antennae, pt stood for 13 mins prior to rest break. Pt was provided with info  regarding memory compensations, community MS society and exercise and MS. Education provided regarding memory compensations and keeping thinking skills sharp. Pt verbalizes understanding. Pt was provided with Dr. Garth Bigness contact information as he is an MS specialist.                   OT Short Term Goals - 02/22/18 1449      OT SHORT TERM GOAL #1   Title  I with HEP    Status  Achieved      OT SHORT TERM GOAL #2   Title  Pt will verbalize understanding of energy conservation techniques for ADLS/IADLS    Status  Achieved      OT SHORT TERM GOAL #3   Title  Pt will demonstrate ability to perform home management/ functional activities in standing x 20 mins without rest break    Status  Not Met   Pt stood for 13 mins without rest, pt plans to continue workking with PT regarsing balance and activity tolerance       OT Long Term Goals - 02/22/18 1500      OT LONG TERM GOAL #1   Title  Pt will verbalize understanding of compensatory strategies for cognitive and  short term memory deficits.    Status  Achieved      OT LONG TERM  GOAL #2   Title  Pt will perform mod complex home management and cooking at a modified independent level demonstrating good safety awareness.    Status  Partially Met   perfroms light cooking, cleaning, reports doing more     OT LONG TERM GOAL #3   Title  Pt will verbalize understanding of MS community resources.    Status  Achieved   Provided by PT and OT           Plan - 02/22/18 1545    Clinical Impression Statement  Pt demonstrates good overall progress. He agrees with plans to d/c today to conserve visits for the future.    Occupational Profile and client history currently impacting functional performance  The patient is limited in ability to perform household activities, community mobility and ability to work  PMH: MS, vitamin B12 deficiency, MS, macrocytic anemia, inability to work, sleep disturbance per reports ty).      Occupational performance deficits (Please refer to evaluation for details):  ADL's;IADL's;Rest and Sleep;Leisure;Social Participation    Rehab Potential  Good    Current Impairments/barriers affecting progress:  depression, poor activity tolerance,     OT Frequency  1x / week    OT Duration  8 weeks    OT Treatment/Interventions  Self-care/ADL training;Therapeutic exercise;Visual/perceptual remediation/compensation;Patient/family education;Splinting;Neuromuscular education;Energy conservation;Therapist, nutritional;Therapeutic activities;Balance training;Passive range of motion;Manual Therapy;DME and/or AE instruction;Ultrasound;Cryotherapy    Plan  d/c OT    Consulted and Agree with Plan of Care  Patient       Patient will benefit from skilled therapeutic intervention in order to improve the following deficits and impairments:  Abnormal gait, Decreased cognition, Decreased knowledge of use of DME, Pain, Decreased mobility, Decreased coordination, Impaired sensation, Decreased activity tolerance, Decreased endurance, Decreased strength, Impaired UE functional use, Impaired perceived functional ability, Difficulty walking, Decreased safety awareness, Decreased knowledge of precautions, Decreased balance  Visit Diagnosis: Muscle weakness (generalized)  Other symptoms and signs involving the nervous system  Other abnormalities of gait and mobility  Pain in left hand  Pain in right hand  Pain in joints of both feet  Pain in both hands   OCCUPATIONAL THERAPY DISCHARGE SUMMARY    Current functional level related to goals / functional outcomes: Pt made good overall progress, he agrees with plans to d/c today to conserve visits for the future.   Remaining deficits: Decreased strength, decreased balance, decreased activity tolerance   Education / Equipment: Pt was educated regarding HEP, community/ MS information and memory compensations. Pt verbalized understanding.   Plan: Patient agrees to discharge.  Patient goals were partially met. Patient is being discharged due to being pleased with the current functional level.  ?????      Problem List Patient Active Problem List   Diagnosis Date Noted  . Influenza B 04/09/2016  . Demyelinating disease ([place])   . Anemia due to vitamin B12 deficiency   . 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 Multiple sclerosis ([place]) 03/09/2016  . Constipation 03/09/2016  . S/P laparoscopic cholecystectomy & ERCP March 2013 05/06/2011  . Symptomatic cholelithiasis 04/20/2011  . Choledocholithiasis 04/20/2011    Daniel Morales 02/22/2018, 3:47 PM  [facility] [address] [place], [place], [address] Phone: [phone]   Fax:  [phone]  Name: Daniel Morales MRN: [id number] Date of Birth: 1970/11/17

## 2018-02-24 ENCOUNTER — Ambulatory Visit: Payer: BC Managed Care – PPO | Admitting: Rehabilitative and Restorative Service Providers"

## 2018-02-24 ENCOUNTER — Encounter: Payer: Self-pay | Admitting: Rehabilitative and Restorative Service Providers"

## 2018-02-24 DIAGNOSIS — M6281 Muscle weakness (generalized): Secondary | ICD-10-CM

## 2018-02-24 DIAGNOSIS — R29818 Other symptoms and signs involving the nervous system: Secondary | ICD-10-CM

## 2018-02-24 DIAGNOSIS — R2689 Other abnormalities of gait and mobility: Secondary | ICD-10-CM

## 2018-02-24 NOTE — Therapy (Signed)
[facility] [address] [place], [place], [address] Phone: [phone]   Fax:  [phone]  Physical Therapy Treatment  Patient Details  Name: Daniel Morales MRN: [id number] Date of Birth: Sep 18, 1970 Referring Provider (PT): Lavonia Dana, MD   Encounter Date: 02/24/2018  PT End of Session - 02/24/18 1425    Visit Number  9    Number of Visits  17   eval + 16 visits   Date for PT Re-Evaluation  03/10/18    Authorization Type  BCBS State/ awaiting further insurance notes in epic; 02/20/18 VL: 30 pt, ot, slp; $1500 deductible, 30% coinsurance    Authorization - Visit Number  10   pt & ot since 02/01/18 (as of 02/20/18)   Authorization - Number of Visits  30    PT Start Time  1403    PT Stop Time  1445    PT Time Calculation (min)  42 min    Activity Tolerance  Patient tolerated treatment well    Behavior During Therapy  [facility] for tasks assessed/performed       Past Medical History:  Diagnosis Date  . Constipation   . Environmental allergies    year round  . GERD (gastroesophageal reflux disease)    "heartburn at times"  . Headache(784.0)   . Shortness of breath    "bc of my allergies"    Past Surgical History:  Procedure Laterality Date  . CHOLECYSTECTOMY  04/21/2011   Procedure: LAPAROSCOPIC CHOLECYSTECTOMY WITH INTRAOPERATIVE CHOLANGIOGRAM;  Surgeon: Pedro Earls, MD;  Location: [facility];  Service: General;  Laterality: N/A;  . CIRCUMCISION    . ERCP  04/20/2011   Procedure: ENDOSCOPIC RETROGRADE CHOLANGIOPANCREATOGRAPHY (ERCP);  Surgeon: Jeryl Columbia, MD;  Location: [facility];  Service: Endoscopy;  Laterality: N/A;    There were no vitals filed for this visit.  Subjective Assessment - 02/24/18 1402    Subjective  I'm not doing as much stretching, but I'm doing a lot more walking.  He is walking indoors more due to weather.   The patient is getting out of bed more.  He was tired yesterday.  He  notes it may be time to do his B12 injections (1x/month).      Pertinent History  vitamin B12 deficiency, MS, macrocytic anemia    Patient Stated Goals  "Strengthening my legs";  fatigue- patient is sleeping off and on all day.   Takes a long time to get ready and is limited by fatigue.    Currently in Pain?  Yes    Pain Location  Back    Pain Orientation  Lower;Upper    Pain Descriptors / Indicators  Spasm    Pain Radiating Towards  spasms "sharp pain that goes up my back"; also getting more MS hugs    Pain Onset  More than a month ago    Pain Frequency  Intermittent    Aggravating Factors   unsure    Pain Relieving Factors  muscle relaxer                       [facility] Adult PT Treatment/Exercise - 02/24/18 1600      Ambulation/Gait   Ambulation/Gait  Yes    Ambulation/Gait Assistance  7: Independent    Ambulation/Gait Assistance Details  no device walking on level surfaces and over compliant mat surfaces without loss of balance    Ambulation Distance (Feet)  400 Feet    Assistive  device  None    Gait Pattern  Step-through pattern    Gait velocity  2.92 ft/sec    Stairs  Yes    Stairs Assistance  6: Modified independent (Device/Increase time)    Stair Management Technique  Step to pattern;No rails   alternating pattern ascending, step to descending   Number of Stairs  4    Gait Comments  Also worked on gait with increasing speed.       Self-Care   Self-Care  Other Self-Care Comments    Other Self-Care Comments   patient to check on cost of [employer]  membership      Neuro Re-ed    Neuro Re-ed Details   single leg stance activities beginning in small lunge and moving into warrior 3 type pose (using a ball to balance against therapy mat), marching on compliant surfaces without loss of balance      Exercises   Exercises  Other Exercises    Other Exercises   step downs anterorly and laterally for quad control during eccentric contraction; isometric lunge/holds  to work on right quad contraction; stretching in butterfly stretch and 1/2 straddle; modified quadriped (leaning UEs on mat without WB through knees due to h/o knee discomfort) performing alternating UE/LE extension working on core stability.               PT Short Term Goals - 02/17/18 1418      PT SHORT TERM GOAL #1   Title  The patient will verbalize understanding of energy conservation techniques due to reports of fatigue limiting daily tasks. Target all STGs 02/17/18-extended due to missed 1 week    Baseline  PT has focused more on a schedule to have patient get out of bed for longer periods during the day.     Time  4    Period  Weeks    Status  Partially Met      PT SHORT TERM GOAL #2   Title  The patient will improve Berg from 43/56 to > or equal to 48/56 to demo dec'ing risk for falls.    Baseline  52/56.    Time  4    Period  Weeks    Status  Achieved      PT SHORT TERM GOAL #3   Title  The patient will improve gait speed from 1.55 ft/sec to > or equal to 2.0 ft/sec to demo dec'ing risk for falls.    Time  4    Period  Weeks    Status  Achieved      PT SHORT TERM GOAL #4   Title  The patient will ambulate nonstop x 500 ft with least restrictive device mod indep in order to perform increasing community ambulation.    Time  4    Period  Weeks    Status  Achieved      PT SHORT TERM GOAL #5   Title  The patient will move floor<>stand with UE support mod indep.    Baseline  Patient is able to do without leaning on support of a mat-- pressing UEs through the floor.    Time  4    Period  Weeks    Status  Achieved        PT Long Term Goals - 02/24/18 1432      PT LONG TERM GOAL #1   Title  The patient will be indep with HEP for LE strengthening, flexibility, core stability and general conditioning.  Target date all LTGs 03/17/18-extended due to missed one week    Time  8    Period  Weeks    Status  On-going      PT LONG TERM GOAL #2   Title  The patient will  improve gait speed from 1.55 ft/sec to > or equal to 2.5 ft/sec to demo improving mobility for community ambulation.    Baseline  2.92 ft/sec    Time  8    Period  Weeks    Status  Achieved      PT LONG TERM GOAL #3   Title   The patient will improve Berg balance score from 43/56 to > or equal to 50/56 to demo dec'ing risk for falls.    Baseline  Met on 02/10/2018    Time  8    Period  Weeks    Status  Achieved      PT LONG TERM GOAL #4   Title  The patient will negotiate 12 steps without a handrail and reciprocal pattern independently.    Time  8    Period  Weeks    Status  On-going      PT LONG TERM GOAL #5   Title  The patient will negotiate community surfaces without a device independently x 1500 ft nonstop including grass, ramps, curbs to demo return to full community mobility.    Time  8    Period  Weeks    Status  On-going            Plan - 02/24/18 1605    Clinical Impression Statement  The patient is progressing towards LTGs and PT reducing frequency for next 2 weeks with goal of allowing patient time to check in with [employer].  Anticipate early d/c as patient is able to tolerate more general activity and is meeting goals ahead of schedule.     PT Treatment/Interventions  ADLs/Self Care Home Management;Balance training;Neuromuscular re-education;Patient/family education;Gait training;Stair training;Functional mobility training;Therapeutic activities;Therapeutic exercise;Manual techniques    PT Next Visit Plan  check on [facility] membership; progress community walking dec'ing use of SpC (outdoors if weather permits, or simulate outdoors), standing balance, endurance, strengthening, general conditining.    Consulted and Agree with Plan of Care  Patient       Patient will benefit from skilled therapeutic intervention in order to improve the following deficits and impairments:  Abnormal gait, Decreased endurance, Impaired sensation, Decreased activity tolerance,  Decreased balance, Decreased mobility, Pain, Decreased strength  Visit Diagnosis: Muscle weakness (generalized)  Other symptoms and signs involving the nervous system  Other abnormalities of gait and mobility     Problem List Patient Active Problem List   Diagnosis Date Noted  . Influenza B 04/09/2016  . Demyelinating disease ([place])   . Anemia due to vitamin B12 deficiency   . 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 Multiple sclerosis ([place]) 03/09/2016  . Constipation 03/09/2016  . S/P laparoscopic cholecystectomy & ERCP March 2013 05/06/2011  . Symptomatic cholelithiasis 04/20/2011  . Choledocholithiasis 04/20/2011    Marquise Lambson, PT 02/24/2018, 4:10 PM  [facility] [address] [place], [place], [address] Phone: [phone]   Fax:  [phone]  Name: Daniel Morales MRN: [id number] Date of Birth: 1970/05/22

## 2018-02-27 ENCOUNTER — Encounter: Payer: Self-pay | Admitting: Rehabilitative and Restorative Service Providers"

## 2018-02-27 ENCOUNTER — Ambulatory Visit: Payer: BC Managed Care – PPO | Admitting: Rehabilitative and Restorative Service Providers"

## 2018-02-27 DIAGNOSIS — R2689 Other abnormalities of gait and mobility: Secondary | ICD-10-CM

## 2018-02-27 DIAGNOSIS — M6281 Muscle weakness (generalized): Secondary | ICD-10-CM | POA: Diagnosis not present

## 2018-02-27 DIAGNOSIS — R29818 Other symptoms and signs involving the nervous system: Secondary | ICD-10-CM

## 2018-02-27 NOTE — Therapy (Signed)
[facility] [address] [place], [place], [address] Phone: [phone]   Fax:  [phone]  Physical Therapy Treatment  Patient Details  Name: Daniel Morales MRN: [id number] Date of Birth: [birth year] Referring Provider (PT): Lavonia Dana, MD   Encounter Date: 02/27/2018  PT End of Session - 02/27/18 1409    Visit Number  10    Number of Visits  17   eval + 16 visits   Date for PT Re-Evaluation  03/10/18    Authorization Type  BCBS State/ awaiting further insurance notes in epic; 02/20/18 VL: 30 pt, ot, slp; $1500 deductible, 30% coinsurance    Authorization - Visit Number  11   pt & ot since 02/01/18 (as of 02/20/18)   Authorization - Number of Visits  30    PT Start Time  1405    PT Stop Time  1445    PT Time Calculation (min)  40 min    Activity Tolerance  Patient tolerated treatment well    Behavior During Therapy  [facility] for tasks assessed/performed       Past Medical History:  Diagnosis Date  . Constipation   . Environmental allergies    year round  . GERD (gastroesophageal reflux disease)    "heartburn at times"  . Headache(784.0)   . Shortness of breath    "bc of my allergies"    Past Surgical History:  Procedure Laterality Date  . CHOLECYSTECTOMY  04/21/2011   Procedure: LAPAROSCOPIC CHOLECYSTECTOMY WITH INTRAOPERATIVE CHOLANGIOGRAM;  Surgeon: Pedro Earls, MD;  Location: [facility];  Service: General;  Laterality: N/A;  . CIRCUMCISION    . ERCP  04/20/2011   Procedure: ENDOSCOPIC RETROGRADE CHOLANGIOPANCREATOGRAPHY (ERCP);  Surgeon: Jeryl Columbia, MD;  Location: [facility];  Service: Endoscopy;  Laterality: N/A;    There were no vitals filed for this visit.  Subjective Assessment - 02/27/18 1407    Subjective  The patient notes that he was fatigued after therapy on Friday and stayed in bed most of the day Saturday.      Pertinent History  vitamin B12 deficiency, MS, macrocytic anemia     Patient Stated Goals  "Strengthening my legs";  fatigue- patient is sleeping off and on all day.   Takes a long time to get ready and is limited by fatigue.    Currently in Pain?  Yes   "just the common ones"  noting legs are sore.                      [facility] Adult PT Treatment/Exercise - 02/27/18 1421      Ambulation/Gait   Ambulation/Gait  Yes    Ambulation/Gait Assistance  6: Modified independent (Device/Increase time)    Ambulation/Gait Assistance Details  No device emphasizing longer stride length and arm swing with wider base of support.    Stairs  Yes    Ramp  6: Modified independent (Device)    Curb  6: Modified independent (Device/increase time)    Gait Comments  slowed pace today due to general fatigue and soreness; patient ambulated 500 ft nonstop working on arm swing and longer stride; slowed pace with curb and ramp negotiation      Self-Care   Self-Care  Other Self-Care Comments    Other Self-Care Comments   Discussed gym routine of 5 minutes - 10 minutes on seated stepper and beginning with 2 or 3 strengthening machines.        Exercises  Exercises  Other Exercises    Other Exercises   Seated hamstring stretch, prone hip flexor stretch, bridging with legs on ball x 5 reps with 5 second holds.    Supine rolling ball brining knees to your chest, bridge + knees to chest x 5 reps.  Bridging with straight leg raise x 2 reps on each side.      Knee/Hip Exercises: Aerobic   Stepper  L2 x 5 minutes- recommended for community exercises.                PT Short Term Goals - 02/17/18 1418      PT SHORT TERM GOAL #1   Title  The patient will verbalize understanding of energy conservation techniques due to reports of fatigue limiting daily tasks. Target all STGs 02/17/18-extended due to missed 1 week    Baseline  PT has focused more on a schedule to have patient get out of bed for longer periods during the day.     Time  4    Period  Weeks    Status   Partially Met      PT SHORT TERM GOAL #2   Title  The patient will improve Berg from 43/56 to > or equal to 48/56 to demo dec'ing risk for falls.    Baseline  52/56.    Time  4    Period  Weeks    Status  Achieved      PT SHORT TERM GOAL #3   Title  The patient will improve gait speed from 1.55 ft/sec to > or equal to 2.0 ft/sec to demo dec'ing risk for falls.    Time  4    Period  Weeks    Status  Achieved      PT SHORT TERM GOAL #4   Title  The patient will ambulate nonstop x 500 ft with least restrictive device mod indep in order to perform increasing community ambulation.    Time  4    Period  Weeks    Status  Achieved      PT SHORT TERM GOAL #5   Title  The patient will move floor<>stand with UE support mod indep.    Baseline  Patient is able to do without leaning on support of a mat-- pressing UEs through the floor.    Time  4    Period  Weeks    Status  Achieved        PT Long Term Goals - 02/24/18 1432      PT LONG TERM GOAL #1   Title  The patient will be indep with HEP for LE strengthening, flexibility, core stability and general conditioning. Target date all LTGs 03/17/18-extended due to missed one week    Time  8    Period  Weeks    Status  On-going      PT LONG TERM GOAL #2   Title  The patient will improve gait speed from 1.55 ft/sec to > or equal to 2.5 ft/sec to demo improving mobility for community ambulation.    Baseline  2.92 ft/sec    Time  8    Period  Weeks    Status  Achieved      PT LONG TERM GOAL #3   Title   The patient will improve Berg balance score from 43/56 to > or equal to 50/56 to demo dec'ing risk for falls.    Baseline  Met on 02/10/2018    Time  8    Period  Weeks    Status  Achieved      PT LONG TERM GOAL #4   Title  The patient will negotiate 12 steps without a handrail and reciprocal pattern independently.    Time  8    Period  Weeks    Status  On-going      PT LONG TERM GOAL #5   Title  The patient will negotiate  community surfaces without a device independently x 1500 ft nonstop including grass, ramps, curbs to demo return to full community mobility.    Time  8    Period  Weeks    Status  On-going            Plan - 02/27/18 1454    Clinical Impression Statement  Fatigue continues to be the most limiting factor.  PT modified treatment today due to fatigue from Friday.  Plan to continue working to [place].    PT Treatment/Interventions  ADLs/Self Care Home Management;Balance training;Neuromuscular re-education;Patient/family education;Gait training;Stair training;Functional mobility training;Therapeutic activities;Therapeutic exercise;Manual techniques    PT Next Visit Plan  check on [facility] membership; progress community walking dec'ing use of SpC (outdoors if weather permits, or simulate outdoors), standing balance, endurance, strengthening, general conditining.    Consulted and Agree with Plan of Care  Patient       Patient will benefit from skilled therapeutic intervention in order to improve the following deficits and impairments:  Abnormal gait, Decreased endurance, Impaired sensation, Decreased activity tolerance, Decreased balance, Decreased mobility, Pain, Decreased strength  Visit Diagnosis: Muscle weakness (generalized)  Other symptoms and signs involving the nervous system  Other abnormalities of gait and mobility     Problem List Patient Active Problem List   Diagnosis Date Noted  . Influenza B 04/09/2016  . Demyelinating disease ([place])   . Anemia due to vitamin B12 deficiency   . 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 Multiple sclerosis ([place]) 03/09/2016  . Constipation 03/09/2016  . S/P laparoscopic cholecystectomy & ERCP March 2013 05/06/2011  . Symptomatic cholelithiasis 04/20/2011  . Choledocholithiasis 04/20/2011    Judy Pollman , PT 02/27/2018, 2:55 PM  [facility] [address] [place], [place],  [address] Phone: [phone]   Fax:  [phone]  Name: Daniel Morales MRN: [id number] Date of Birth: 1970-04-05

## 2018-03-01 ENCOUNTER — Encounter: Payer: BC Managed Care – PPO | Admitting: Occupational Therapy

## 2018-03-03 ENCOUNTER — Ambulatory Visit: Payer: BC Managed Care – PPO | Admitting: Physical Therapy

## 2018-03-06 ENCOUNTER — Ambulatory Visit: Payer: BC Managed Care – PPO | Admitting: Rehabilitative and Restorative Service Providers"

## 2018-03-08 ENCOUNTER — Encounter: Payer: BC Managed Care – PPO | Admitting: Occupational Therapy

## 2018-03-10 ENCOUNTER — Ambulatory Visit: Payer: BC Managed Care – PPO | Admitting: Rehabilitative and Restorative Service Providers"

## 2018-03-13 ENCOUNTER — Ambulatory Visit: Payer: BC Managed Care – PPO | Admitting: Rehabilitative and Restorative Service Providers"

## 2018-03-15 ENCOUNTER — Encounter: Payer: BC Managed Care – PPO | Admitting: Occupational Therapy

## 2018-03-17 ENCOUNTER — Ambulatory Visit: Payer: BC Managed Care – PPO | Admitting: Rehabilitative and Restorative Service Providers"

## 2018-03-22 ENCOUNTER — Encounter: Payer: BC Managed Care – PPO | Admitting: Occupational Therapy

## 2018-03-24 ENCOUNTER — Ambulatory Visit
Payer: BC Managed Care – PPO | Attending: Internal Medicine | Admitting: Rehabilitative and Restorative Service Providers"

## 2018-03-24 DIAGNOSIS — M6281 Muscle weakness (generalized): Secondary | ICD-10-CM | POA: Diagnosis not present

## 2018-03-24 DIAGNOSIS — R2689 Other abnormalities of gait and mobility: Secondary | ICD-10-CM | POA: Insufficient documentation

## 2018-03-24 DIAGNOSIS — R29818 Other symptoms and signs involving the nervous system: Secondary | ICD-10-CM | POA: Diagnosis present

## 2018-03-24 NOTE — Addendum Note (Signed)
Addended by: Margretta Ditty M on: 03/24/2018 04:14 PM   Modules accepted: Orders

## 2018-03-24 NOTE — Therapy (Signed)
[facility] [address] [place], [place], [address] Phone: [phone]   Fax:  [phone]  Physical Therapy Treatment and goal Update  Patient Details  Name: Daniel Morales MRN: [id number] Date of Birth: 05/25/1970 Referring Provider (PT): Lavonia Dana, MD   Encounter Date: 03/24/2018  PT End of Session - 03/24/18 1416    Visit Number  11    Number of Visits  17   eval + 16 visits   Date for PT Re-Evaluation  03/10/18    Authorization Type  [employer] [place]/ awaiting further insurance notes in epic; 02/20/18 VL: 30 pt, ot, slp; $1500 deductible, 30% coinsurance    Authorization - Visit Number  12   pt & ot since 02/01/18 (as of 02/20/18)   Authorization - Number of Visits  30    PT Start Time  1410    PT Stop Time  1450    PT Time Calculation (min)  40 min    Activity Tolerance  Patient tolerated treatment well    Behavior During Therapy  [facility] for tasks assessed/performed       Past Medical History:  Diagnosis Date  . Constipation   . Environmental allergies    year round  . GERD (gastroesophageal reflux disease)    "heartburn at times"  . Headache(784.0)   . Shortness of breath    "bc of my allergies"    Past Surgical History:  Procedure Laterality Date  . CHOLECYSTECTOMY  04/21/2011   Procedure: LAPAROSCOPIC CHOLECYSTECTOMY WITH INTRAOPERATIVE CHOLANGIOGRAM;  Surgeon: Pedro Earls, MD;  Location: [facility];  Service: General;  Laterality: N/A;  . CIRCUMCISION    . ERCP  04/20/2011   Procedure: ENDOSCOPIC RETROGRADE CHOLANGIOPANCREATOGRAPHY (ERCP);  Surgeon: Jeryl Columbia, MD;  Location: [facility];  Service: Endoscopy;  Laterality: N/A;    There were no vitals filed for this visit.  Subjective Assessment - 03/24/18 1412    Subjective  The patient reports he has more bad days than good days still.  He has been able to go out with his wife more lately, but still fatigues when sitting in a chair that  doesn't have a head rest.    He has been doing exercises at home.  He did too much one day on his total gym and was sore for days.      Patient Stated Goals  "Strengthening my legs";  fatigue- patient is sleeping off and on all day.   Takes a long time to get ready and is limited by fatigue.                       [facility] Adult PT Treatment/Exercise - 03/24/18 1603      Ambulation/Gait   Ambulation/Gait  Yes    Ambulation/Gait Assistance  7: Independent    Ambulation/Gait Assistance Details  The patient is able to ambulate in the clinic without a device today.  He continues to rely on [facility] in the community.     Ambulation Distance (Feet)  600 Feet    Assistive device  None    Gait Pattern  Step-through pattern    Stairs  Yes    Stairs Assistance  6: Modified independent (Device/Increase time)   slowed pace   Stair Management Technique  No rails;Alternating pattern    Number of Stairs  12      Self-Care   Self-Care  Other Self-Care Comments    Other Self-Care Comments   Discussed  fatigue and managing by having a regular schedule and exercising to tolerance.  Also educated on ways to slowly progress exercise routine to avoid excessive fatigue and soreness.      Exercises   Exercises  Other Exercises    Other Exercises   Discussed HEP and patient is not doing stretching seated or thomas test stretch in bed.  Therefore, modified to be done in standing position.                PT Education - 03/24/18 1439    Education Details  updated HEP/ stretching    Person(s) Educated  Patient    Methods  Explanation;Demonstration;Handout    Comprehension  Verbalized understanding;Returned demonstration       PT Short Term Goals - 02/17/18 1418      PT SHORT TERM GOAL #1   Title  The patient will verbalize understanding of energy conservation techniques due to reports of fatigue limiting daily tasks. Target all STGs 02/17/18-extended due to missed 1 week    Baseline  PT has  focused more on a schedule to have patient get out of bed for longer periods during the day.     Time  4    Period  Weeks    Status  Partially Met      PT SHORT TERM GOAL #2   Title  The patient will improve Berg from 43/56 to > or equal to 48/56 to demo dec'ing risk for falls.    Baseline  52/56.    Time  4    Period  Weeks    Status  Achieved      PT SHORT TERM GOAL #3   Title  The patient will improve gait speed from 1.55 ft/sec to > or equal to 2.0 ft/sec to demo dec'ing risk for falls.    Time  4    Period  Weeks    Status  Achieved      PT SHORT TERM GOAL #4   Title  The patient will ambulate nonstop x 500 ft with least restrictive device mod indep in order to perform increasing community ambulation.    Time  4    Period  Weeks    Status  Achieved      PT SHORT TERM GOAL #5   Title  The patient will move floor<>stand with UE support mod indep.    Baseline  Patient is able to do without leaning on support of a mat-- pressing UEs through the floor.    Time  4    Period  Weeks    Status  Achieved        PT Long Term Goals - 03/24/18 1424      PT LONG TERM GOAL #1   Title  The patient will be indep with HEP for LE strengthening, flexibility, core stability and general conditioning. Target date all LTGs 03/17/18-extended due to missed one week    Time  8    Period  Weeks    Status  Achieved      PT LONG TERM GOAL #2   Title  The patient will improve gait speed from 1.55 ft/sec to > or equal to 2.5 ft/sec to demo improving mobility for community ambulation.    Baseline  2.92 ft/sec    Time  8    Period  Weeks    Status  Achieved      PT LONG TERM GOAL #3   Title   The patient will  improve Berg balance score from 43/56 to > or equal to 50/56 to demo dec'ing risk for falls.    Baseline  Met on 02/10/2018    Time  8    Period  Weeks    Status  Achieved      PT LONG TERM GOAL #4   Title  The patient will negotiate 12 steps without a handrail and reciprocal pattern  independently.    Baseline  Met on 03/24/2018    Time  8    Period  Weeks    Status  Achieved      PT LONG TERM GOAL #5   Title  The patient will negotiate community surfaces without a device independently x 1500 ft nonstop including grass, ramps, curbs to demo return to full community mobility.    Baseline  Walked indoors due to cold temperatures x 600 ft before heaviness and fatigue in legs limited patient and he requested to walk.    Time  8    Period  Weeks    Status  Not Met        long term goal update:    PT Long Term Goals - 03/24/18 1610      PT LONG TERM GOAL #1   Title  The patient will ambulate community distances x 1000 ft without a device on paved and grassy surfaces.    Time  8    Period  Weeks    Target Date  05/23/18      PT LONG TERM GOAL #2   Title  The patient will be indep with HEP progression.    Time  8    Period  Weeks    Target Date  05/23/18          Plan - 03/24/18 1608    Clinical Impression Statement  The patient met 4/5 LTGs.  He is continuing to use a device in the community and gets burning/tightness in thighs when walking extended distances.  The patient and PT discussed one f/u visit in 2 months to ensure home program progressing and measure progress with community gait.  Also want to further discuss joning a gym at that time as right now he feels hesitant due to not enough "good days" to think he will use his membership.     PT Treatment/Interventions  ADLs/Self Care Home Management;Balance training;Neuromuscular re-education;Patient/family education;Gait training;Stair training;Functional mobility training;Therapeutic activities;Therapeutic exercise;Manual techniques    PT Next Visit Plan  check on [facility] membership; progress community walking dec'ing use of [employer] (outdoors if weather permits, or simulate outdoors).  Progress home program/ activity.    Consulted and Agree with Plan of Care  Patient       Patient will benefit from  skilled therapeutic intervention in order to improve the following deficits and impairments:  Abnormal gait, Decreased endurance, Impaired sensation, Decreased activity tolerance, Decreased balance, Decreased mobility, Pain, Decreased strength  Visit Diagnosis: Muscle weakness (generalized)  Other symptoms and signs involving the nervous system  Other abnormalities of gait and mobility     Problem List Patient Active Problem List   Diagnosis Date Noted  . Influenza B 04/09/2016  . Demyelinating disease ([place])   . Anemia due to vitamin B12 deficiency   . 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 Multiple sclerosis ([place]) 03/09/2016  . Constipation 03/09/2016  . S/P laparoscopic cholecystectomy & ERCP March 2013 05/06/2011  . Symptomatic cholelithiasis 04/20/2011  . Choledocholithiasis 04/20/2011    Absalom Aro, PT 03/24/2018, 4:10 PM  [facility] [address] [place], [place], [address] Phone: [phone]   Fax:  [phone]  Name: Daniel Morales MRN: [id number] Date of Birth: [birth year]

## 2018-03-24 NOTE — Patient Instructions (Signed)
Access Code: [facility]  URL: [email or web address]  Date: 03/24/2018  Prepared by: Margretta Ditty   Program Notes  Walking: 5 minutes at a time 3 times/ day.   Exercises Standing Gastroc Stretch - 3 reps - 3 sets - 30 seconds hold - 1x daily - 7x weekly Wall Squat - 10 reps - 1 sets - 1x daily - 7x weekly Standing Hip Flexor Stretch with Foot Elevated - 3 reps - 1 sets - 30 seconds hold - 1x daily - 7x weekly Standing Heel Raise - 10 reps - 1 sets - 1x daily - 7x weekly Standing Hamstring Stretch on Chair - 3 reps - 1 sets - 30 seconds hold - 1x daily - 7x weekly

## 2018-05-15 ENCOUNTER — Ambulatory Visit: Payer: BC Managed Care – PPO | Admitting: Rehabilitative and Restorative Service Providers"

## 2018-05-17 ENCOUNTER — Telehealth: Payer: Self-pay | Admitting: Occupational Therapy

## 2018-05-17 NOTE — Telephone Encounter (Signed)
Therapist left patient a message regarding his interest in telehealth for PT. Pt was instructed to call back if he is interested in scheduling a telehealth visit. Keene Breath, OTR/L [phone] Phone: [phone] 3:25 PM 05/17/18

## 2018-08-11 ENCOUNTER — Encounter: Payer: Self-pay | Admitting: Rehabilitative and Restorative Service Providers"

## 2018-08-11 NOTE — Therapy (Signed)
[facility] [address] [place], [place], [address] Phone: [phone]   Fax:  [phone]  Patient Details  Name: Daniel Morales MRN: [id number] Date of Birth: 29-Nov-1970 Referring Provider:  No ref. provider found  Encounter Date: last encounter 03/24/2018  PHYSICAL THERAPY DISCHARGE SUMMARY  Visits from Start of Care: 11  Current functional level related to goals / functional outcomes: PT Short Term Goals - 02/17/18 1418      PT SHORT TERM GOAL #1   Title  The patient will verbalize understanding of energy conservation techniques due to reports of fatigue limiting daily tasks. Target all STGs 02/17/18-extended due to missed 1 week    Baseline  PT has focused more on a schedule to have patient get out of bed for longer periods during the day.     Time  4    Period  Weeks    Status  Partially Met      PT SHORT TERM GOAL #2   Title  The patient will improve Berg from 43/56 to > or equal to 48/56 to demo dec'ing risk for falls.    Baseline  52/56.    Time  4    Period  Weeks    Status  Achieved      PT SHORT TERM GOAL #3   Title  The patient will improve gait speed from 1.55 ft/sec to > or equal to 2.0 ft/sec to demo dec'ing risk for falls.    Time  4    Period  Weeks    Status  Achieved      PT SHORT TERM GOAL #4   Title  The patient will ambulate nonstop x 500 ft with least restrictive device mod indep in order to perform increasing community ambulation.    Time  4    Period  Weeks    Status  Achieved      PT SHORT TERM GOAL #5   Title  The patient will move floor<>stand with UE support mod indep.    Baseline  Patient is able to do without leaning on support of a mat-- pressing UEs through the floor.    Time  4    Period  Weeks    Status  Achieved      PT Long Term Goals - 03/24/18 1610      PT LONG TERM GOAL #1   Title  The patient will ambulate community distances x 1000 ft without a device on  paved and grassy surfaces.    Time  8    Period  Weeks    Target Date  05/23/18      PT LONG TERM GOAL #2   Title  The patient will be indep with HEP progression.    Time  8    Period  Weeks    Target Date  05/23/18         Remaining deficits: *The patient was placed on hold due to clinic changes during [place] closure.  Multiple messages left for patient to call if interested in returning to complete plan of care without further follow-up.   Education / Equipment: Home program, [employer].  Plan: Patient agrees to discharge.  Patient goals were not met. Patient is being discharged due to not returning since the last visit.  ?????     Thank you for the referral of this patient. Rudell Cobb, MPT    [place] 08/11/2018, 1:31 PM  [facility]  [address] [place], [place], [address] Phone: [phone]   Fax:  [phone]
